# Patient Record
Sex: Male | Born: 1940 | Hispanic: Yes
Health system: Southern US, Community
[De-identification: ages and names within clinical notes are randomized; demographics above are authoritative.]

---

## 2008-10-03 DEATH — deceased

## 2014-11-07 ENCOUNTER — Inpatient Hospital Stay (HOSPITAL_COMMUNITY): Payer: Self-pay

## 2014-11-07 ENCOUNTER — Inpatient Hospital Stay (HOSPITAL_COMMUNITY)
Admission: EM | Admit: 2014-11-07 | Discharge: 2014-11-13 | DRG: 176 | Disposition: A | Discharge status: Home / Self Care | Payer: Self-pay | Source: Other Acute Inpatient Hospital | Attending: Internal Medicine | Admitting: Internal Medicine

## 2014-11-07 ENCOUNTER — Inpatient Hospital Stay (HOSPITAL_COMMUNITY): Payer: MEDICAID

## 2014-11-07 DIAGNOSIS — R748 Abnormal levels of other serum enzymes: Secondary | ICD-10-CM | POA: Diagnosis present

## 2014-11-07 DIAGNOSIS — N179 Acute kidney failure, unspecified: Secondary | ICD-10-CM | POA: Diagnosis present

## 2014-11-07 DIAGNOSIS — Z09 Encounter for follow-up examination after completed treatment for conditions other than malignant neoplasm: Secondary | ICD-10-CM

## 2014-11-07 DIAGNOSIS — E0822 Diabetes mellitus due to underlying condition with diabetic chronic kidney disease: Secondary | ICD-10-CM | POA: Insufficient documentation

## 2014-11-07 DIAGNOSIS — N183 Chronic kidney disease, stage 3 (moderate): Secondary | ICD-10-CM | POA: Diagnosis present

## 2014-11-07 DIAGNOSIS — I2699 Other pulmonary embolism without acute cor pulmonale: Secondary | ICD-10-CM | POA: Diagnosis present

## 2014-11-07 DIAGNOSIS — I82409 Acute embolism and thrombosis of unspecified deep veins of unspecified lower extremity: Secondary | ICD-10-CM | POA: Insufficient documentation

## 2014-11-07 DIAGNOSIS — N189 Chronic kidney disease, unspecified: Secondary | ICD-10-CM

## 2014-11-07 DIAGNOSIS — E1121 Type 2 diabetes mellitus with diabetic nephropathy: Secondary | ICD-10-CM | POA: Diagnosis present

## 2014-11-07 DIAGNOSIS — K219 Gastro-esophageal reflux disease without esophagitis: Secondary | ICD-10-CM | POA: Diagnosis present

## 2014-11-07 DIAGNOSIS — I2692 Saddle embolus of pulmonary artery without acute cor pulmonale: Principal | ICD-10-CM | POA: Diagnosis present

## 2014-11-07 DIAGNOSIS — R0902 Hypoxemia: Secondary | ICD-10-CM | POA: Diagnosis present

## 2014-11-07 DIAGNOSIS — I82412 Acute embolism and thrombosis of left femoral vein: Secondary | ICD-10-CM | POA: Diagnosis present

## 2014-11-07 DIAGNOSIS — I5032 Chronic diastolic (congestive) heart failure: Secondary | ICD-10-CM | POA: Diagnosis present

## 2014-11-07 LAB — COMPREHENSIVE METABOLIC PANEL
ALT: 23 U/L (ref 17–63)
AST: 16 U/L (ref 15–41)
Albumin: 3.1 g/dL — ABNORMAL LOW (ref 3.5–5.0)
Alkaline Phosphatase: 58 U/L (ref 38–126)
Anion gap: 7 (ref 5–15)
BILIRUBIN TOTAL: 1.3 mg/dL — AB (ref 0.3–1.2)
BUN: 16 mg/dL (ref 6–20)
CO2: 26 mmol/L (ref 22–32)
CREATININE: 1.34 mg/dL — AB (ref 0.61–1.24)
Calcium: 8.4 mg/dL — ABNORMAL LOW (ref 8.9–10.3)
Chloride: 103 mmol/L (ref 101–111)
GFR calc Af Amer: 58 mL/min — ABNORMAL LOW (ref >60)
GFR calc non Af Amer: 50 mL/min — ABNORMAL LOW (ref >60)
Glucose, Bld: 181 mg/dL — ABNORMAL HIGH (ref 65–99)
Potassium: 4.2 mmol/L (ref 3.5–5.1)
Sodium: 136 mmol/L (ref 135–145)
TOTAL PROTEIN: 6.8 g/dL (ref 6.5–8.1)

## 2014-11-07 LAB — BASIC METABOLIC PANEL
Anion gap: 5 (ref 5–15)
BUN: 15 mg/dL (ref 6–20)
CO2: 26 mmol/L (ref 22–32)
CREATININE: 1.19 mg/dL (ref 0.61–1.24)
Calcium: 8.6 mg/dL — ABNORMAL LOW (ref 8.9–10.3)
Chloride: 106 mmol/L (ref 101–111)
GFR calc Af Amer: >60 mL/min (ref >60)
GFR, EST NON AFRICAN AMERICAN: 58 mL/min — AB (ref >60)
Glucose, Bld: 168 mg/dL — ABNORMAL HIGH (ref 65–99)
Potassium: 4 mmol/L (ref 3.5–5.1)
SODIUM: 137 mmol/L (ref 135–145)

## 2014-11-07 LAB — CBC
HCT: 36.8 % — ABNORMAL LOW (ref 39.0–52.0)
HEMOGLOBIN: 11.9 g/dL — AB (ref 13.0–17.0)
MCH: 28.6 pg (ref 26.0–34.0)
MCHC: 32.3 g/dL (ref 30.0–36.0)
MCV: 88.5 fL (ref 78.0–100.0)
Platelets: 164 10*3/uL (ref 150–400)
RBC: 4.16 MIL/uL — ABNORMAL LOW (ref 4.22–5.81)
RDW: 13.3 % (ref 11.5–15.5)
WBC: 11.7 10*3/uL — AB (ref 4.0–10.5)

## 2014-11-07 LAB — GLUCOSE, CAPILLARY
GLUCOSE-CAPILLARY: 182 mg/dL — AB (ref 65–99)
GLUCOSE-CAPILLARY: 179 mg/dL — AB (ref 65–99)
GLUCOSE-CAPILLARY: 182 mg/dL — AB (ref 65–99)
Glucose-Capillary: 164 mg/dL — ABNORMAL HIGH (ref 65–99)
Glucose-Capillary: 140 mg/dL — ABNORMAL HIGH (ref 65–99)

## 2014-11-07 LAB — LACTIC ACID, PLASMA: Lactic Acid, Venous: 1.2 mmol/L (ref 0.5–2.0)

## 2014-11-07 LAB — HEPARIN LEVEL (UNFRACTIONATED)
HEPARIN UNFRACTIONATED: 0.43 [IU]/mL (ref 0.30–0.70)
HEPARIN UNFRACTIONATED: 0.3 [IU]/mL (ref 0.30–0.70)

## 2014-11-07 LAB — TROPONIN I
TROPONIN I: 0.14 ng/mL — AB (ref <0.031)
Troponin I: 0.33 ng/mL — ABNORMAL HIGH (ref <0.031)
Troponin I: 0.16 ng/mL — ABNORMAL HIGH (ref <0.031)

## 2014-11-07 LAB — MAGNESIUM: Magnesium: 2 mg/dL (ref 1.7–2.4)

## 2014-11-07 LAB — PHOSPHORUS: Phosphorus: 3.3 mg/dL (ref 2.5–4.6)

## 2014-11-07 MED ORDER — ASPIRIN 81 MG PO CHEW: 324.0000 mg | CHEWABLE_TABLET | ORAL | Status: AC

## 2014-11-07 MED ORDER — ASPIRIN 300 MG RE SUPP: 300.0000 mg | RECTAL | Status: AC

## 2014-11-07 MED ORDER — HEPARIN (PORCINE) IN NACL 100-0.45 UNIT/ML-% IJ SOLN
1600.0000 [IU]/h | INTRAMUSCULAR | Status: DC
  Administered 2014-11-07: 1200 [IU]/h via INTRAVENOUS
  Administered 2014-11-08: 1500 [IU]/h via INTRAVENOUS
  Administered 2014-11-08 – 2014-11-13 (×7): 1600 [IU]/h via INTRAVENOUS
  Filled 2014-11-07 (×13): qty 250

## 2014-11-07 MED ORDER — INSULIN ASPART 100 UNIT/ML ~~LOC~~ SOLN
0.0000 [IU] | SUBCUTANEOUS | Status: DC
  Administered 2014-11-07: 1 [IU] via SUBCUTANEOUS
  Administered 2014-11-07: 2 [IU] via SUBCUTANEOUS
  Administered 2014-11-07: 1 [IU] via SUBCUTANEOUS
  Administered 2014-11-07 – 2014-11-08 (×4): 2 [IU] via SUBCUTANEOUS
  Administered 2014-11-08: 5 [IU] via SUBCUTANEOUS
  Administered 2014-11-08 – 2014-11-09 (×3): 1 [IU] via SUBCUTANEOUS
  Administered 2014-11-09: 2 [IU] via SUBCUTANEOUS
  Administered 2014-11-09: 3 [IU] via SUBCUTANEOUS
  Administered 2014-11-09: 2 [IU] via SUBCUTANEOUS
  Administered 2014-11-09: 3 [IU] via SUBCUTANEOUS
  Administered 2014-11-09: 1 [IU] via SUBCUTANEOUS
  Administered 2014-11-10: 2 [IU] via SUBCUTANEOUS
  Administered 2014-11-10 (×2): 1 [IU] via SUBCUTANEOUS
  Administered 2014-11-10: 2 [IU] via SUBCUTANEOUS
  Administered 2014-11-10: 3 [IU] via SUBCUTANEOUS
  Administered 2014-11-10: 2 [IU] via SUBCUTANEOUS
  Administered 2014-11-11: 1 [IU] via SUBCUTANEOUS
  Administered 2014-11-11: 2 [IU] via SUBCUTANEOUS
  Administered 2014-11-11: 1 [IU] via SUBCUTANEOUS
  Administered 2014-11-11: 2 [IU] via SUBCUTANEOUS
  Administered 2014-11-11 – 2014-11-12 (×4): 1 [IU] via SUBCUTANEOUS
  Administered 2014-11-12 – 2014-11-13 (×2): 2 [IU] via SUBCUTANEOUS
  Administered 2014-11-13: 1 [IU] via SUBCUTANEOUS

## 2014-11-07 MED ORDER — ACETAMINOPHEN 325 MG PO TABS: 650.0000 mg | ORAL_TABLET | ORAL | Status: DC | PRN

## 2014-11-07 MED ORDER — SODIUM CHLORIDE 0.9 % IV SOLN
250.0000 mL | INTRAVENOUS | Status: DC | PRN
Start: 2014-11-07 — End: 2014-11-13

## 2014-11-07 MED ORDER — FAMOTIDINE IN NACL 20-0.9 MG/50ML-% IV SOLN
20.0000 mg | Freq: Two times a day (BID) | INTRAVENOUS | Status: DC
  Administered 2014-11-07 – 2014-11-10 (×8): 20 mg via INTRAVENOUS
  Filled 2014-11-07 (×11): qty 50

## 2014-11-07 MED ORDER — ONDANSETRON HCL 4 MG/2ML IJ SOLN: 4.0000 mg | Freq: Four times a day (QID) | INTRAMUSCULAR | Status: DC | PRN

## 2014-11-07 MED ORDER — CETYLPYRIDINIUM CHLORIDE 0.05 % MT LIQD
7.0000 mL | Freq: Two times a day (BID) | OROMUCOSAL | Status: DC
  Administered 2014-11-07 – 2014-11-13 (×13): 7 mL via OROMUCOSAL

## 2014-11-07 NOTE — Progress Notes (Signed)
ANTICOAGULATION CONSULT NOTE - Initial Consult  Pharmacy Consult for Heparin  Indication: pulmonary embolus  Allergies not on file  Patient Measurements: Height:  (170.2 cm) Weight: 179 lb 10.8 oz (81.5 kg) IBW/kg (Calculated) : 66.1  Vital Signs: Temp: 97.9 F (36.6 C) (08/04 0420) Temp Source: Oral (08/04 0420) BP: 126/72 mmHg (08/04 0415) Pulse Rate: 106 (08/04 0415)   Labs from outside hospital Hgb 13.7 Scr 1.8 INR 1  Assessment: 74 y/o M transfer from outside hospital for new onset PE. Heparin 5000 units BOLUS was given ~0030 and pt is currently on heparin drip at 1200 units/hr.   Goal of Therapy:  Heparin level 0.3-0.7 units/ml Monitor platelets by anticoagulation protocol: Yes   Plan:  -Continue heparin drip at 1200 units/hr -0800 HL -Daily CBC/HL -Monitor for bleeding  Abran Duke 11/07/2014,4:28 AM

## 2014-11-07 NOTE — Progress Notes (Addendum)
ANTICOAGULATION CONSULT NOTE - Follow Up Consult  Pharmacy Consult for Heparin  Indication: pulmonary embolus  Allergies not on file  Patient Measurements: Height:  (170.2 cm) Weight: 179 lb 10.8 oz (81.5 kg) IBW/kg (Calculated) : 66.1  Vital Signs: Temp: 97.6 F (36.4 C) (08/04 0910) Temp Source: Oral (08/04 0910) BP: 115/85 mmHg (08/04 0900) Pulse Rate: 107 (08/04 0900)   Labs from outside hospital Hgb 13.7 Scr 1.8 INR 1  Assessment: 74 y/o M on IV heparin at 1200 units/hr for new onset PE. Heparin level this AM is therapeutic at 0.43. Other labs as above. No s/s of bleeding observed.   Goal of Therapy:  Heparin level 0.3-0.7 units/ml Monitor platelets by anticoagulation protocol: Yes   Plan:  -Continue heparin drip at 1200 units/hr -1700 HL -Daily CBC/HL -Monitor for bleeding  Vinnie Level, PharmD., BCPS Clinical Pharmacist Pager [phone]   ADDENDUM:  HL came back borderline therapeutic at 0.3. No issues per RN.  Plan: Increase heparin gtt to 1300 units/hr Check HL at 0200 Monitor daily HL, CBC, s/s of bleed

## 2014-11-07 NOTE — H&P (Signed)
PULMONARY / CRITICAL CARE MEDICINE   Name: Antonio Medina MRN: [id number] DOB: 1940-07-05    ADMISSION DATE:  11/07/2014  REFERRING MD :  [facility]  CHIEF COMPLAINT:  Chest pain  INITIAL PRESENTATION: 74 year old transferred from [facility] for submassive saddle PE, hemodynamically stable  STUDIES:  CT angiogram 8/3 >> saddle PE, RV/LV ratio 1.8  SIGNIFICANT EVENTS:    HISTORY OF PRESENT ILLNESS:  74 year old Spanish only speaking diabetic, presented with chest pain ongoing for 3 weeks-he does not see a doctor. He is Spanish-speaking only and no interpreter available at the time of history taking. He reports substernal chest pain radiating to his right side. He came into [place] where he had a positive d-dimer, no recent surgery, CT scan showed saddle PE, was placed on heparin and transferred to [facility]. First troponin was negative, repeat troponin was 0.3 . Labs also showed a creatinine 1.8. He was hypoxic to 91% on room air, no hypertension documented  PAST MEDICAL HISTORY :   has no past medical history on file.  has no past surgical history on file. Prior to Admission medications   Not on File   Allergies not on file  FAMILY HISTORY:  has no family status information on file.  SOCIAL HISTORY:    REVIEW OF SYSTEMS:  Detailed review of systems-unable to obtain due to language barrier. Says chest pain is improved, dyspnea is improved Denies bleeding from any site-no history of hematemesis or melenic All he reports diabetes which is controlled with medication No history of syncope or recent trauma No prior history of head injury  SUBJECTIVE:   VITAL SIGNS: Temp:  [97.9 F (36.6 C)] 97.9 F (36.6 C) (08/04 0420) Pulse Rate:  [102-113] 102 (08/04 0700) Resp:  [18-24] 23 (08/04 0700) BP: (107-134)/(66-89) 134/86 mmHg (08/04 0700) SpO2:  [97 %-99 %] 98 % (08/04 0700) Weight:  [179 lb 10.8 oz (81.5 kg)] 179 lb 10.8 oz (81.5 kg) (08/04 0405) HEMODYNAMICS:   VENTILATOR  SETTINGS:   INTAKE / OUTPUT:  Intake/Output Summary (Last 24 hours) at 11/07/14 0718 Last data filed at 11/07/14 0700  Gross per 24 hour  Intake     15 ml  Output    270 ml  Net   -255 ml    PHYSICAL EXAMINATION: Gen. Pleasant, well-nourished, in no distress, normal affect ENT - no lesions, no post nasal drip Neck: No JVD, no thyromegaly, no carotid bruits Lungs: no use of accessory muscles, no dullness to percussion, clear without rales or rhonchi  Cardiovascular: Rhythm regular, heart sounds  normal, no murmurs, no peripheral edema Abdomen: soft and non-tender, no hepatosplenomegaly, BS normal. Musculoskeletal: No deformities, no cyanosis or clubbing Neuro:  alert, non focal Skin:  Warm, no lesions/ rash   LABS:  CBC  Recent Labs Lab 11/07/14 0510  WBC 11.7*  HGB 11.9*  HCT 36.8*  PLT 164   Coag's No results for input(s): APTT, INR in the last 168 hours. BMET  Recent Labs Lab 11/07/14 0510  NA 136  K 4.2  CL 103  CO2 26  BUN 16  CREATININE 1.34*  GLUCOSE 181*   Electrolytes  Recent Labs Lab 11/07/14 0510  CALCIUM 8.4*  MG 2.0  PHOS 3.3   Sepsis Markers  Recent Labs Lab 11/07/14 0510  LATICACIDVEN 1.2   ABG No results for input(s): PHART, PCO2ART, PO2ART in the last 168 hours. Liver Enzymes  Recent Labs Lab 11/07/14 0510  AST 16  ALT 23  ALKPHOS 58  BILITOT 1.3*  ALBUMIN 3.1*   Cardiac Enzymes  Recent Labs Lab 11/07/14 0510  TROPONINI 0.33*   Glucose  Recent Labs Lab 11/07/14 0419  GLUCAP 164*    Imaging Dg Chest Port 1 View  11/07/2014   CLINICAL DATA:  Dyspnea  EXAM: PORTABLE CHEST - 1 VIEW  COMPARISON:  None.  FINDINGS: There is patchy opacity in the lateral right base with elevation of the hemidiaphragm. This may represent atelectasis or scarring or early infiltrate. The left lung is clear. There is no large effusion. Heart size is normal.  IMPRESSION: Patchy right lateral base opacity. This could represent scarring,  atelectasis or early infectious infiltrate.   Electronically Signed   By: Ellery Plunk M.D.   On: 11/07/2014 05:08     ASSESSMENT / PLAN:  PULMONARY  A: Acute submassive, saddle PE P:   IV heparin Obtain echo and duplex Called and spoke to interventional radiology-he does have a pesi score of 1 due to hypoxia but is hemodynamically stable. The high RV/LV ratio 1.8 is concerning-however he seems to have done well with heparin alone and would continue conservative approach unless he deteriorates or troponin climbs higher   RENAL A:  At-risk AKi, cr 1.8- not sure if this is baseline P:   Hydrate post contrast, follow creatinine  GASTROINTESTINAL A:  No issues P:   Nothing by mouth pending course for next 12 hours  HEMATOLOGIC A:  On IV heparin P:  Follow CBC and platelets   ENDOCRINE A:  Diabetes   P:   SSI   FAMILY  - Updates: None at bedside  - Inter-disciplinary family meet or Palliative Care meeting due by:  NA    TODAY'S SUMMARY: Acute submassive pulmonary embolism, conservative approach, IRcalled buthold off on catheter lysis    Cyril Mourning MD. FCCP.  Pulmonary & Critical care Pager [phone] If no response call [phone]    11/07/2014, 7:18 AM

## 2014-11-07 NOTE — Progress Notes (Signed)
eLink Physician-Brief Progress Note Patient Name: Antonio Medina DOB: 05/25/1940 MRN: [id number]   Date of Service  11/07/2014  HPI/Events of Note  74 yo male transferred from [place] after complaints of CP, SOB, found to have saddle PE with right heart strain on CT Chest, hemodynamic stable.  Got heparin bolus and started on heparin gtt at [facility], currently stable.   eICU Interventions  Saddle PE - CXR, ECHO - cont with Heparin gtt, may require tPA if hemodynamics change - cbc, bmp, mg, phos - full note to follow      Intervention Category Evaluation Type: New Patient Evaluation  Teila Skalsky 11/07/2014, 6:34 AM

## 2014-11-07 NOTE — Progress Notes (Signed)
  Echocardiogram 2D Echocardiogram has been performed.  Arvil Chaco 11/07/2014, 2:08 PM

## 2014-11-07 NOTE — Progress Notes (Addendum)
PULMONARY / CRITICAL CARE MEDICINE   Name: Antonio Medina MRN: [id number] DOB: June 16, 1940    ADMISSION DATE:  11/07/2014  TODAY'S DATE: 11/07/14  REFERRING MD :  [facility]  CHIEF COMPLAINT:  Chest pain  INITIAL PRESENTATION: 74 year old transferred from [facility] for submassive saddle PE, hemodynamically stable  STUDIES:  CT angiogram 8/3 >> saddle PE, RV/LV ratio 1.8 8/4 echo>>>The cavity size was dilated. Wall thickness was normal. Systolic function was moderately reduced. 55%, grade 1 diast, rv overload, PA 45  SIGNIFICANT EVENTS:  SUBJECTIVE: no distress, on min O2  VITAL SIGNS: Temp:  [97.6 F (36.4 C)-97.9 F (36.6 C)] 97.6 F (36.4 C) (08/04 0910) Pulse Rate:  [102-113] 107 (08/04 0900) Resp:  [18-24] 18 (08/04 0900) BP: (107-134)/(66-89) 115/85 mmHg (08/04 0900) SpO2:  [94 %-99 %] 96 % (08/04 0900) Weight:  [81.5 kg (179 lb 10.8 oz)] 81.5 kg (179 lb 10.8 oz) (08/04 0405) HEMODYNAMICS:   VENTILATOR SETTINGS:   INTAKE / OUTPUT:  Intake/Output Summary (Last 24 hours) at 11/07/14 0938 Last data filed at 11/07/14 0900  Gross per 24 hour  Intake     51 ml  Output    270 ml  Net   -219 ml    PHYSICAL EXAMINATION: General: awake, alert distress Neuro: nonfocal, perrl HEENT: no stridor PULM: CTA CV: s1 s2 RRR GI: soft, BS wnl, no r Extremities: no edema   LABS:  CBC  Recent Labs Lab 11/07/14 0510  WBC 11.7*  HGB 11.9*  HCT 36.8*  PLT 164   Coag's No results for input(s): APTT, INR in the last 168 hours. BMET  Recent Labs Lab 11/07/14 0510  NA 136  K 4.2  CL 103  CO2 26  BUN 16  CREATININE 1.34*  GLUCOSE 181*   Electrolytes  Recent Labs Lab 11/07/14 0510  CALCIUM 8.4*  MG 2.0  PHOS 3.3   Sepsis Markers  Recent Labs Lab 11/07/14 0510  LATICACIDVEN 1.2   ABG No results for input(s): PHART, PCO2ART, PO2ART in the last 168 hours. Liver Enzymes  Recent Labs Lab 11/07/14 0510  AST 16  ALT 23  ALKPHOS 58  BILITOT 1.3*   ALBUMIN 3.1*   Cardiac Enzymes  Recent Labs Lab 11/07/14 0510  TROPONINI 0.33*   Glucose  Recent Labs Lab 11/07/14 0419 11/07/14 0854  GLUCAP 164* 182*    Imaging Dg Chest Port 1 View  11/07/2014   CLINICAL DATA:  Dyspnea  EXAM: PORTABLE CHEST - 1 VIEW  COMPARISON:  None.  FINDINGS: There is patchy opacity in the lateral right base with elevation of the hemidiaphragm. This may represent atelectasis or scarring or early infiltrate. The left lung is clear. There is no large effusion. Heart size is normal.  IMPRESSION: Patchy right lateral base opacity. This could represent scarring, atelectasis or early infectious infiltrate.   Electronically Signed   By: Andreas Newport M.D.   On: 11/07/2014 05:08     ASSESSMENT / PLAN:  PULMONARY  A: Acute submassive, saddle PE P:   IV heparin Duplex lowers pending Chest pain free He is now a long time period from PE onset, risk / benefit ratio favors heparin continuation pcxr for infarct risk, had linear changes rt base last pcxr  RENAL A:  At-risk AKi, cr 1.8- not sure if this is baseline, ATN likley resolving P:   Allow pos balance Chem in am not needed  GASTROINTESTINAL A:  No issues P:   Feed pepcid  HEMATOLOGIC A:  On IV  heparin P:  Start oral agent From [place], unsure of availability to meds / add coumadin (most likley to be able to get) Needs cancer screening, ana, esr in am   ENDOCRINE A:  Diabetes   P:   SSI   FAMILY  - Updates: None at bedside  - Inter-disciplinary family meet or Palliative Care meeting due by:  NA   to tele, [facility]  Lavon Paganini. Titus Mould, MD, [place] Pgr: [facility]  11/07/2014, 9:38 AM

## 2014-11-07 NOTE — Progress Notes (Signed)
IR aware of this pt with (B)PE and right heart strain. Currently plan to hold off PE lysis procedure. Brief discussion with pt, spanish speaking only, no family present. C/o left sided chest pain and mild SOB, as well as mild headache. No history recorded but no apparent contraindications for lysis treatment. BP 124/79 mmHg  Pulse 111  Temp(Src) 97.6 F (36.4 C) (Oral)  Resp 20  Ht  (1.702 m)  Wt 179 lb 10.8 oz (81.5 kg)  BMI 28.13 kg/m2  SpO2 94% Echo and LE venous duplex ordered, not yet done. On IV hep gtt. PCCM to re-eval this am and let IR team know if PE lysis is reconsidered. Keeping pt NPO for the moment until final decision. Will report to Dr. Linus Orn PA-C Interventional Radiology 11/07/2014 9:16 AM

## 2014-11-08 ENCOUNTER — Inpatient Hospital Stay (HOSPITAL_COMMUNITY): Payer: Self-pay

## 2014-11-08 DIAGNOSIS — I2699 Other pulmonary embolism without acute cor pulmonale: Secondary | ICD-10-CM | POA: Insufficient documentation

## 2014-11-08 LAB — GLUCOSE, CAPILLARY
GLUCOSE-CAPILLARY: 146 mg/dL — AB (ref 65–99)
GLUCOSE-CAPILLARY: 152 mg/dL — AB (ref 65–99)
Glucose-Capillary: 162 mg/dL — ABNORMAL HIGH (ref 65–99)
Glucose-Capillary: 125 mg/dL — ABNORMAL HIGH (ref 65–99)
Glucose-Capillary: 174 mg/dL — ABNORMAL HIGH (ref 65–99)
Glucose-Capillary: 254 mg/dL — ABNORMAL HIGH (ref 65–99)
Glucose-Capillary: 147 mg/dL — ABNORMAL HIGH (ref 65–99)

## 2014-11-08 LAB — CBC
HCT: 35.8 % — ABNORMAL LOW (ref 39.0–52.0)
Hemoglobin: 11.7 g/dL — ABNORMAL LOW (ref 13.0–17.0)
MCH: 29 pg (ref 26.0–34.0)
MCHC: 32.7 g/dL (ref 30.0–36.0)
MCV: 88.8 fL (ref 78.0–100.0)
Platelets: 168 10*3/uL (ref 150–400)
RBC: 4.03 MIL/uL — ABNORMAL LOW (ref 4.22–5.81)
RDW: 13.4 % (ref 11.5–15.5)
WBC: 9.3 10*3/uL (ref 4.0–10.5)

## 2014-11-08 LAB — HEPARIN LEVEL (UNFRACTIONATED)
HEPARIN UNFRACTIONATED: 0.26 [IU]/mL — AB (ref 0.30–0.70)
Heparin Unfractionated: 0.65 IU/mL (ref 0.30–0.70)

## 2014-11-08 MED ORDER — HEPARIN BOLUS VIA INFUSION
4000.0000 [IU] | Freq: Once | INTRAVENOUS | Status: AC
  Administered 2014-11-08: 4000 [IU] via INTRAVENOUS
  Filled 2014-11-08: qty 4000

## 2014-11-08 MED ORDER — WARFARIN SODIUM 7.5 MG PO TABS
7.5000 mg | ORAL_TABLET | Freq: Once | ORAL | Status: AC
  Administered 2014-11-08: 7.5 mg via ORAL
  Filled 2014-11-08: qty 1

## 2014-11-08 MED ORDER — WARFARIN - PHARMACIST DOSING INPATIENT
Freq: Every day | Status: DC
  Administered 2014-11-09 – 2014-11-11 (×4)

## 2014-11-08 NOTE — Progress Notes (Addendum)
ANTICOAGULATION CONSULT NOTE - Follow Up Consult  Pharmacy Consult for Heparin/Warfarin  Indication: Saddle pulmonary embolus  No Known Allergies  Patient Measurements: Height:  (170.2 cm) Weight: 182 lb 1.6 oz (82.6 kg) IBW/kg (Calculated) : 66.1  Vital Signs: Temp: 97.8 F (36.6 C) (08/05 0738) Temp Source: Oral (08/05 0738) BP: 156/78 mmHg (08/05 0800) Pulse Rate: 77 (08/05 0800)  Labs:  Recent Labs  11/07/14 0510 11/07/14 0842 11/07/14 1033 11/07/14 1625 11/08/14 0208  HGB 11.9*  --   --   --  11.7*  HCT 36.8*  --   --   --  35.8*  PLT 164  --   --   --  168  HEPARINUNFRC  --  0.43  --  0.30 0.26*  CREATININE 1.34*  --  1.19  --   --   TROPONINI 0.33*  --  0.16* 0.14*  --     Estimated Creatinine Clearance: 55.2 mL/min (by C-G formula based on Cr of 1.19).   Assessment: 71 YOM with new onset saddle PE currently on IV heparin @ 1500 units/hr. HL this AM was sub-therapeutic at 0.26. Patient is felt to be high risk for clot formation now.   Goal of Therapy:  Heparin level 0.3-0.7 units/ml Monitor platelets by anticoagulation protocol: Yes   Plan:  -Give heparin 4000 units bolus, then increase heparin infusion to 1600 units/hr  -1700 HL -Give Coumadin 7.5 mg once today  -Daily CBC/HL and Pt/INR  -Monitor for bleeding  Vinnie Level, PharmD., BCPS Clinical Pharmacist Pager [phone]   ======================   Addendum: - HL 0.65, high therapeutic range though post a large bolus   Plan: - Continue heparin gtt at 1600 units/hr - Check confirmatory HL    Freddi Schrager D. Laney Potash, PharmD, BCPS Pager:  [phone] 11/08/2014, 5:47 PM

## 2014-11-08 NOTE — Progress Notes (Signed)
*  PRELIMINARY RESULTS* Vascular Ultrasound Lower extremity venous duplex has been completed.  Preliminary findings: DVT noted in the left common femoral vein. No DVT RLE.  Farrel Demark, RDMS, RVT  11/08/2014, 11:27 AM

## 2014-11-08 NOTE — Progress Notes (Signed)
ANTICOAGULATION CONSULT NOTE - Follow Up Consult  Pharmacy Consult for Heparin  Indication: pulmonary embolus  No Known Allergies  Patient Measurements: Height:  (170.2 cm) Weight: 179 lb 10.8 oz (81.5 kg) IBW/kg (Calculated) : 66.1  Vital Signs: Temp: 98.6 F (37 C) (08/04 2325) Temp Source: Oral (08/04 2325) BP: 133/82 mmHg (08/04 2100) Pulse Rate: 93 (08/04 2100)  Labs:  Recent Labs  11/07/14 0510 11/07/14 0842 11/07/14 1033 11/07/14 1625 11/08/14 0208  HGB 11.9*  --   --   --  11.7*  HCT 36.8*  --   --   --  35.8*  PLT 164  --   --   --  168  HEPARINUNFRC  --  0.43  --  0.30 0.26*  CREATININE 1.34*  --  1.19  --   --   TROPONINI 0.33*  --  0.16* 0.14*  --     Estimated Creatinine Clearance: 54.8 mL/min (by C-G formula based on Cr of 1.19).   Assessment: Sub-therapeutic heparin level, has been trending down  Goal of Therapy:  Heparin level 0.3-0.7 units/ml Monitor platelets by anticoagulation protocol: Yes   Plan:  -Increase heparin drip to 1500 units/hr -1100 HL -Daily CBC/HL -Monitor for bleeding -F/U plans for oral anti-coagulation   Antonio Medina 11/08/2014,3:06 AM

## 2014-11-08 NOTE — Progress Notes (Signed)
Pt arrived to room 3e19 from 2mw via wc; in no apparent distress; call bell and urinal within reach, bed alarm on; see flowsheet for full assessment/vs Marvia Pickles, RN

## 2014-11-09 ENCOUNTER — Inpatient Hospital Stay (HOSPITAL_COMMUNITY): Payer: Self-pay

## 2014-11-09 LAB — CBC WITH DIFFERENTIAL/PLATELET
Basophils Absolute: 0 10*3/uL (ref 0.0–0.1)
Basophils Relative: 0 % (ref 0–1)
Eosinophils Absolute: 0.3 10*3/uL (ref 0.0–0.7)
Eosinophils Relative: 4 % (ref 0–5)
HEMATOCRIT: 37.2 % — AB (ref 39.0–52.0)
Hemoglobin: 12.1 g/dL — ABNORMAL LOW (ref 13.0–17.0)
Lymphocytes Relative: 27 % (ref 12–46)
Lymphs Abs: 2.3 10*3/uL (ref 0.7–4.0)
MCH: 29 pg (ref 26.0–34.0)
MCHC: 32.5 g/dL (ref 30.0–36.0)
MCV: 89.2 fL (ref 78.0–100.0)
MONOS PCT: 8 % (ref 3–12)
Monocytes Absolute: 0.7 10*3/uL (ref 0.1–1.0)
NEUTROS ABS: 5.3 10*3/uL (ref 1.7–7.7)
Neutrophils Relative %: 61 % (ref 43–77)
Platelets: 192 10*3/uL (ref 150–400)
RBC: 4.17 MIL/uL — AB (ref 4.22–5.81)
RDW: 13.4 % (ref 11.5–15.5)
WBC: 8.6 10*3/uL (ref 4.0–10.5)

## 2014-11-09 LAB — GLUCOSE, CAPILLARY
GLUCOSE-CAPILLARY: 186 mg/dL — AB (ref 65–99)
Glucose-Capillary: 140 mg/dL — ABNORMAL HIGH (ref 65–99)
Glucose-Capillary: 166 mg/dL — ABNORMAL HIGH (ref 65–99)
Glucose-Capillary: 209 mg/dL — ABNORMAL HIGH (ref 65–99)
Glucose-Capillary: 205 mg/dL — ABNORMAL HIGH (ref 65–99)
Glucose-Capillary: 131 mg/dL — ABNORMAL HIGH (ref 65–99)

## 2014-11-09 LAB — HEPARIN LEVEL (UNFRACTIONATED)
Heparin Unfractionated: 0.46 IU/mL (ref 0.30–0.70)
Heparin Unfractionated: 0.44 IU/mL (ref 0.30–0.70)

## 2014-11-09 LAB — BASIC METABOLIC PANEL
Anion gap: 6 (ref 5–15)
BUN: 16 mg/dL (ref 6–20)
CALCIUM: 8.5 mg/dL — AB (ref 8.9–10.3)
CHLORIDE: 101 mmol/L (ref 101–111)
CO2: 25 mmol/L (ref 22–32)
Creatinine, Ser: 1.4 mg/dL — ABNORMAL HIGH (ref 0.61–1.24)
GFR calc Af Amer: 55 mL/min — ABNORMAL LOW (ref >60)
GFR, EST NON AFRICAN AMERICAN: 48 mL/min — AB (ref >60)
GLUCOSE: 217 mg/dL — AB (ref 65–99)
Potassium: 3.9 mmol/L (ref 3.5–5.1)
Sodium: 132 mmol/L — ABNORMAL LOW (ref 135–145)

## 2014-11-09 LAB — PROTIME-INR
INR: 1.11 (ref 0.00–1.49)
Prothrombin Time: 14.5 seconds (ref 11.6–15.2)

## 2014-11-09 LAB — SEDIMENTATION RATE: Sed Rate: 63 mm/hr — ABNORMAL HIGH (ref 0–16)

## 2014-11-09 MED ORDER — WARFARIN SODIUM 7.5 MG PO TABS
7.5000 mg | ORAL_TABLET | Freq: Once | ORAL | Status: AC
  Administered 2014-11-09: 7.5 mg via ORAL
  Filled 2014-11-09: qty 1

## 2014-11-09 MED ORDER — COUMADIN BOOK
Freq: Once | Status: AC
  Administered 2014-11-09: 1
  Filled 2014-11-09: qty 1

## 2014-11-09 NOTE — Progress Notes (Signed)
ANTICOAGULATION CONSULT NOTE - Follow Up Consult  Pharmacy Consult for Heparin  Indication: pulmonary embolus  No Known Allergies  Patient Measurements: Height:  (167.6 cm) Weight: 183 lb (83.008 kg) IBW/kg (Calculated) : 63.8  Vital Signs: Temp: 98.6 F (37 C) (08/06 0212) Temp Source: Oral (08/06 0212) BP: 132/68 mmHg (08/06 0212) Pulse Rate: 72 (08/06 0212)  Labs:  Recent Labs  11/07/14 0510  11/07/14 1033 11/07/14 1625 11/08/14 0208 11/08/14 1710 11/09/14 0044  HGB 11.9*  --   --   --  11.7*  --   --   HCT 36.8*  --   --   --  35.8*  --   --   PLT 164  --   --   --  168  --   --   HEPARINUNFRC  --   < >  --  0.30 0.26* 0.65 0.46  CREATININE 1.34*  --  1.19  --   --   --   --   TROPONINI 0.33*  --  0.16* 0.14*  --   --   --   < > = values in this interval not displayed.  Estimated Creatinine Clearance: 54.2 mL/min (by C-G formula based on Cr of 1.19).  Assessment: Heparin level therapeutic x 2  Goal of Therapy:  Heparin level 0.3-0.7 units/ml Monitor platelets by anticoagulation protocol: Yes   Plan:  -Continue heparin at 1600 units/hr -Daily CBC/HL -Monitor for bleeding  Antonio Medina 11/09/2014,2:30 AM

## 2014-11-09 NOTE — Progress Notes (Signed)
ANTICOAGULATION CONSULT NOTE - Follow Up Consult  Pharmacy Consult for Heparin and Warfarin Indication: pulmonary embolus  No Known Allergies  Patient Measurements: Height:  (167.6 cm) Weight: 182 lb 15.5 oz (82.994 kg) (Scale B) IBW/kg (Calculated) : 63.8  Assessment: 74yo male found to have saddle PE and started on IV Heparin.  He is therapeutic this morning with his heparin therapy with a level of 0.44.  No noted bleeding complications and his CBC is stable as well as his platelets.  He received one dose of Warfarin last evening and his INR this morning is 1.1.  Would expect him to trend higher after 72 hours of therapy.  Education:  I have sent a Spanish Warfarin teaching book for patient and family to review.   Goal of Therapy:  INR -2.0 - 3.0 Heparin level 0.3-0.7 units/ml Monitor platelets by anticoagulation protocol: Yes   Plan:  -Continue heparin at 1600 units/hr -Warfarin 7.5 mg x 1 today -Daily CBC/HL -Monitor for bleeding  Chinita Greenland 11/09/2014,7:22 AM

## 2014-11-09 NOTE — Progress Notes (Signed)
[facility] PROGRESS NOTE  Rochelle Nephew GXQ:[id number] DOB: 06/08/1940 DOA: 11/07/2014 PCP: No primary care provider on file.  Assessment/Plan: Acute submassive, saddle PE -IV heparin -Duplex lowers: DVT noted in the left common femoral vein. -Needs cancer screening, ana, esr in am  -Chest x ray: Mildly increased bilateral mid to lower lung zone opacities. Thiscould be simply due to atelectasis. Possibility of developing pulmonary infarction not excluded. -Follow x ray.  -Incentive spirometry. -he will need PCP for INR.   AKi, cr 1.8- not sure if this is baseline, ATN likley resolving Cr at 1.4. Monitor.  Avoid nephrotoxin.   Diabetes  SSI. Will need prescription for glipizide. Wont be able to resume metformin due to AKI.    Code Status: Full Code.  Family Communication: care discussed with patient in [place].  Disposition Plan: remain in the hospital for treatment with IV heparin, coumadin    Consultants:  CCM admitted patient.   Procedures: Doppler: Preliminary findings: DVT noted in the left common femoral vein. No DVT RLE.  ECHO; - Left ventricle: The cavity size was normal. Systolic function was normal. The estimated ejection fraction was in the range of 50% to 55%. Although no diagnostic regional wall motion abnormality was identified, this possibility cannot be completely excluded on the basis of this study. Doppler parameters are consistent with abnormal left ventricular relaxation (grade 1 diastolic dysfunction). - Ventricular septum: The contour showed systolic flattening. These changes are consistent with RV pressure overload. - Left atrium: The atrium was mildly dilated. - Right ventricle: The cavity size was dilated. Wall thickness was normal. Systolic function was moderately reduced. - Pulmonary arteries: Systolic pressure was moderately increased. PA peak pressure: 45 mm Hg (S).  Antibiotics: None   HPI/Subjective: He is  feeling better, chest pain better.  He is from [place]. He is here visiting daughter. He was supposed to return to [place] 8-24.  Doesn't have PCP  Objective: Filed Vitals:   11/09/14 0546  BP: 133/61  Pulse: 70  Temp: 98.6 F (37 C)  Resp: 20    Intake/Output Summary (Last 24 hours) at 11/09/14 0736 Last data filed at 11/09/14 0548  Gross per 24 hour  Intake  384.1 ml  Output   1726 ml  Net -1341.9 ml   Filed Weights   11/08/14 0300 11/08/14 1019 11/09/14 0546  Weight: 82.6 kg (182 lb 1.6 oz) 83.008 kg (183 lb) 82.994 kg (182 lb 15.5 oz)    Exam:   General:  Alert in no distress.   Cardiovascular: S 1, S 2 RRR  Respiratory: CTA  Abdomen: BS present, soft, nt  Musculoskeletal: trace edema  Data Reviewed: Basic Metabolic Panel:  Recent Labs Lab 11/07/14 0510 11/07/14 1033  NA 136 137  K 4.2 4.0  CL 103 106  CO2 26 26  GLUCOSE 181* 168*  BUN 16 15  CREATININE 1.34* 1.19  CALCIUM 8.4* 8.6*  MG 2.0  --   PHOS 3.3  --    Liver Function Tests:  Recent Labs Lab 11/07/14 0510  AST 16  ALT 23  ALKPHOS 58  BILITOT 1.3*  PROT 6.8  ALBUMIN 3.1*   No results for input(s): LIPASE, AMYLASE in the last 168 hours. No results for input(s): AMMONIA in the last 168 hours. CBC:  Recent Labs Lab 11/07/14 0510 11/08/14 0208 11/09/14 0328  WBC 11.7* 9.3 8.6  NEUTROABS  --   --  5.3  HGB 11.9* 11.7* 12.1*  HCT 36.8* 35.8* 37.2*  MCV 88.5 88.8  89.2  PLT 164 168 192   Cardiac Enzymes:  Recent Labs Lab 11/07/14 0510 11/07/14 1033 11/07/14 1625  TROPONINI 0.33* 0.16* 0.14*   BNP (last 3 results) No results for input(s): BNP in the last 8760 hours.  ProBNP (last 3 results) No results for input(s): PROBNP in the last 8760 hours.  CBG:  Recent Labs Lab 11/08/14 1253 11/08/14 1808 11/08/14 2133 11/08/14 2354 11/09/14 0401  GLUCAP 174* 254* 147* 152* 140*    No results found for this or any previous visit (from the past 240 hour(s)).    Studies: No results found.  Scheduled Meds: . antiseptic oral rinse  7 mL Mouth Rinse BID  . coumadin book   Does not apply Once  . famotidine (PEPCID) IV  20 mg Intravenous Q12H  . insulin aspart  0-9 Units Subcutaneous 6 times per day  . warfarin  7.5 mg Oral ONCE-1800  . Warfarin - Pharmacist Dosing Inpatient   Does not apply q1800   Continuous Infusions: . heparin 1,600 Units/hr (11/08/14 2136)    Active Problems:   Acute pulmonary embolus   PE (pulmonary embolism)   Pulmonary emboli    Time spent: 35 minutes.     Niel Hummer A  [facility] Pager [phone]. If 7PM-7AM, please contact night-coverage at [email or web address], password [facility] 11/09/2014, 7:36 AM  LOS: 2 days

## 2014-11-10 LAB — CBC
HEMATOCRIT: 37.7 % — AB (ref 39.0–52.0)
Hemoglobin: 12.3 g/dL — ABNORMAL LOW (ref 13.0–17.0)
MCH: 29.1 pg (ref 26.0–34.0)
MCHC: 32.6 g/dL (ref 30.0–36.0)
MCV: 89.1 fL (ref 78.0–100.0)
Platelets: 214 10*3/uL (ref 150–400)
RBC: 4.23 MIL/uL (ref 4.22–5.81)
RDW: 13.2 % (ref 11.5–15.5)
WBC: 8.2 10*3/uL (ref 4.0–10.5)

## 2014-11-10 LAB — GLUCOSE, CAPILLARY
GLUCOSE-CAPILLARY: 130 mg/dL — AB (ref 65–99)
GLUCOSE-CAPILLARY: 154 mg/dL — AB (ref 65–99)
GLUCOSE-CAPILLARY: 203 mg/dL — AB (ref 65–99)
Glucose-Capillary: 152 mg/dL — ABNORMAL HIGH (ref 65–99)
Glucose-Capillary: 158 mg/dL — ABNORMAL HIGH (ref 65–99)

## 2014-11-10 LAB — PROTIME-INR
INR: 1.32 (ref 0.00–1.49)
Prothrombin Time: 16.5 seconds — ABNORMAL HIGH (ref 11.6–15.2)

## 2014-11-10 LAB — HEPARIN LEVEL (UNFRACTIONATED): Heparin Unfractionated: 0.63 IU/mL (ref 0.30–0.70)

## 2014-11-10 MED ORDER — TRAMADOL HCL 50 MG PO TABS
25.0000 mg | ORAL_TABLET | Freq: Four times a day (QID) | ORAL | Status: DC | PRN
  Administered 2014-11-10: 25 mg via ORAL
  Filled 2014-11-10: qty 1

## 2014-11-10 MED ORDER — GUAIFENESIN ER 600 MG PO TB12
600.0000 mg | ORAL_TABLET | Freq: Two times a day (BID) | ORAL | Status: DC
  Administered 2014-11-10 – 2014-11-13 (×7): 600 mg via ORAL
  Filled 2014-11-10 (×9): qty 1

## 2014-11-10 MED ORDER — WARFARIN SODIUM 7.5 MG PO TABS
7.5000 mg | ORAL_TABLET | Freq: Once | ORAL | Status: AC
  Administered 2014-11-10: 7.5 mg via ORAL
  Filled 2014-11-10: qty 1

## 2014-11-10 NOTE — Progress Notes (Addendum)
ANTICOAGULATION CONSULT NOTE  Pharmacy Consult for Heparin and Warfarin Indication: pulmonary embolus  No Known Allergies  Patient Measurements: Height:  (167.6 cm) Weight: 182 lb 8.7 oz (82.8 kg) IBW/kg (Calculated) : 63.8  Assessment: 74yo male found to have saddle PE and started on IV Heparin. He is therapeutic on heparin, his INR is 1.32, as expected after 2 days of warfarin. No noted bleeding complications and his CBC is stable as well as his platelets.    Goal of Therapy:  INR -2.0 - 3.0 Heparin level 0.3-0.7 units/ml Monitor platelets by anticoagulation protocol: Yes   Plan:  -Continue heparin at 1600 units/hr -Warfarin 7.5 mg x 1 today -Daily CBC/HL/INR -Monitor for s/sx bleeding -Continue heparin or Lovenox for a minimum of 5 days and until INR is therapeutic > 24 hours  Lachrista Heslin, Darl Householder 11/10/2014,8:30 AM

## 2014-11-10 NOTE — Progress Notes (Signed)
[facility] PROGRESS NOTE  Antonio Medina WPY:[id number] DOB: [birth year] DOA: 11/07/2014 PCP: No primary care provider on file.  Assessment/Plan: Acute submassive, saddle PE -IV heparin -Duplex lowers: DVT noted in the left common femoral vein. -Needs cancer screening, ana , esr 63 -Chest x ray: Mildly increased bilateral mid to lower lung zone opacities. Thiscould be simply due to atelectasis. Possibility of developing pulmonary infarction not excluded. -Follow intermittent  x ray.  -Incentive spirometry. -he will need PCP for INR.  -continue with coumadin. INR 1.3.  AKi, cr 1.8- not sure if this is baseline, ATN likley resolving Cr at 1.4. Monitor.  Avoid nephrotoxin.   Diabetes  SSI. Will need prescription for glipizide. Wont be able to resume metformin due to AKI.    Code Status: Full Code.  Family Communication: care discussed with patient in [place]. Discussed care with daughter who was at bedside.  Disposition Plan: remain in the hospital for treatment with IV heparin, coumadin    Consultants:  CCM admitted patient.   Procedures: Doppler: Preliminary findings: DVT noted in the left common femoral vein. No DVT RLE.  ECHO; - Left ventricle: The cavity size was normal. Systolic function was normal. The estimated ejection fraction was in the range of 50% to 55%. Although no diagnostic regional wall motion abnormality was identified, this possibility cannot be completely excluded on the basis of this study. Doppler parameters are consistent with abnormal left ventricular relaxation (grade 1 diastolic dysfunction). - Ventricular septum: The contour showed systolic flattening. These changes are consistent with RV pressure overload. - Left atrium: The atrium was mildly dilated. - Right ventricle: The cavity size was dilated. Wall thickness was normal. Systolic function was moderately reduced. - Pulmonary arteries: Systolic pressure was moderately  increased. PA peak pressure: 45 mm Hg (S).  Antibiotics: None   HPI/Subjective: Relates some cough, mild chest pain/   Objective: Filed Vitals:   11/10/14 0953  BP: 127/97  Pulse: 72  Temp: 98.5 F (36.9 C)  Resp: 18    Intake/Output Summary (Last 24 hours) at 11/10/14 1448 Last data filed at 11/10/14 0951  Gross per 24 hour  Intake   1084 ml  Output   1675 ml  Net   -591 ml   Filed Weights   11/08/14 1019 11/09/14 0546 11/10/14 0545  Weight: 83.008 kg (183 lb) 82.994 kg (182 lb 15.5 oz) 82.8 kg (182 lb 8.7 oz)    Exam:   General:  Alert in no distress.   Cardiovascular: S 1, S 2 RRR  Respiratory: CTA  Abdomen: BS present, soft, nt  Musculoskeletal: trace edema  Data Reviewed: Basic Metabolic Panel:  Recent Labs Lab 11/07/14 0510 11/07/14 1033 11/09/14 0951  NA 136 137 132*  K 4.2 4.0 3.9  CL 103 106 101  CO2 _0 GLUCOSE 181* 168* 217*  BUN _1 CREATININE 1.34* 1.19 1.40*  CALCIUM 8.4* 8.6* 8.5*  MG 2.0  --   --   PHOS 3.3  --   --    Liver Function Tests:  Recent Labs Lab 11/07/14 0510  AST 16  ALT 23  ALKPHOS 58  BILITOT 1.3*  PROT 6.8  ALBUMIN 3.1*   No results for input(s): LIPASE, AMYLASE in the last 168 hours. No results for input(s): AMMONIA in the last 168 hours. CBC:  Recent Labs Lab 11/07/14 0510 11/08/14 0208 11/09/14 0328 11/10/14 0653  WBC 11.7* 9.3 8.6 8.2  NEUTROABS  --   --  5.3  --  HGB 11.9* 11.7* 12.1* 12.3*  HCT 36.8* 35.8* 37.2* 37.7*  MCV 88.5 88.8 89.2 89.1  PLT 164 168 192 214   Cardiac Enzymes:  Recent Labs Lab 11/07/14 0510 11/07/14 1033 11/07/14 1625  TROPONINI 0.33* 0.16* 0.14*   BNP (last 3 results) No results for input(s): BNP in the last 8760 hours.  ProBNP (last 3 results) No results for input(s): PROBNP in the last 8760 hours.  CBG:  Recent Labs Lab 11/09/14 1632 11/09/14 1940 11/09/14 2327 11/10/14 0347 11/10/14 1128  GLUCAP 205* 186* 131* 130* 154*     No results found for this or any previous visit (from the past 240 hour(s)).   Studies: Dg Chest Port 1 View  11/09/2014   CLINICAL DATA:  Pulmonary emboli followup  EXAM: PORTABLE CHEST - 1 VIEW  COMPARISON:  11/07/2014  FINDINGS: Limited inspiratory effect. Cardiac silhouette unchanged and within normal limits. Increased interstitial markings in the mid to lower lung zones bilaterally  IMPRESSION: Mildly increased bilateral mid to lower lung zone opacities. This could be simply due to atelectasis. Possibility of developing pulmonary infarction not excluded.   Electronically Signed   By: Skipper Cliche M.D.   On: 11/09/2014 09:31    Scheduled Meds: . antiseptic oral rinse  7 mL Mouth Rinse BID  . famotidine (PEPCID) IV  20 mg Intravenous Q12H  . guaiFENesin  600 mg Oral BID  . insulin aspart  0-9 Units Subcutaneous 6 times per day  . warfarin  7.5 mg Oral ONCE-1800  . Warfarin - Pharmacist Dosing Inpatient   Does not apply q1800   Continuous Infusions: . heparin 1,600 Units/hr (11/10/14 0700)    Active Problems:   Acute pulmonary embolus   PE (pulmonary embolism)   Pulmonary emboli    Time spent: 35 minutes.     Niel Hummer A  [facility] Pager [phone]. If 7PM-7AM, please contact night-coverage at [email or web address], password [facility] 11/10/2014, 2:48 PM  LOS: 3 days

## 2014-11-10 NOTE — Discharge Instructions (Addendum)
Information on my medicine - Coumadin   (Warfarin)   **This is the same as what was provided to you in Spanish   Why was Coumadin prescribed for you? Coumadin was prescribed for you because you have a blood clot or a medical condition that can cause an increased risk of forming blood clots. Blood clots can cause serious health problems by blocking the flow of blood to the heart, lung, or brain. Coumadin can prevent harmful blood clots from forming. As a reminder your indication for Coumadin is:   Pulmonary Embolism Treatment  What test will check on my response to Coumadin? While on Coumadin (warfarin) you will need to have an INR test regularly to ensure that your dose is keeping you in the desired range. The INR (international normalized ratio) number is calculated from the result of the laboratory test called prothrombin time (PT).  If an INR APPOINTMENT HAS NOT ALREADY BEEN MADE FOR YOU please schedule an appointment to have this lab work done by your health care provider within 7 days. Your INR goal is usually a number between:  2 to 3 or your provider may give you a more narrow range like 2-2.5.  Ask your health care provider during an office visit what your goal INR is.  What  do you need to  know  About  COUMADIN? Take Coumadin (warfarin) exactly as prescribed by your healthcare provider about the same time each day.  DO NOT stop taking without talking to the doctor who prescribed the medication.  Stopping without other blood clot prevention medication to take the place of Coumadin may increase your risk of developing a new clot or stroke.  Get refills before you run out.  What do you do if you miss a dose? If you miss a dose, take it as soon as you remember on the same day then continue your regularly scheduled regimen the next day.  Do not take two doses of Coumadin at the same time.  Important Safety Information A possible side effect of Coumadin (Warfarin) is an increased risk of  bleeding. You should call your healthcare provider right away if you experience any of the following: ? Bleeding from an injury or your nose that does not stop. ? Unusual colored urine (red or dark brown) or unusual colored stools (red or black). ? Unusual bruising for unknown reasons. ? A serious fall or if you hit your head (even if there is no bleeding).  Some foods or medicines interact with Coumadin (warfarin) and might alter your response to warfarin. To help avoid this: ? Eat a balanced diet, maintaining a consistent amount of Vitamin K. ? Notify your provider about major diet changes you plan to make. ? Avoid alcohol or limit your intake to 1 drink for women and 2 drinks for men per day. (1 drink is 5 oz. wine, 12 oz. beer, or 1.5 oz. liquor.)  Make sure that ANY health care provider who prescribes medication for you knows that you are taking Coumadin (warfarin).  Also make sure the healthcare provider who is monitoring your Coumadin knows when you have started a new medication including herbals and non-prescription products.  Coumadin (Warfarin)  Major Drug Interactions  Increased Warfarin Effect Decreased Warfarin Effect  Alcohol (large quantities) Antibiotics (esp. Septra/Bactrim, Flagyl, Cipro) Amiodarone (Cordarone) Aspirin (ASA) Cimetidine (Tagamet) Megestrol (Megace) NSAIDs (ibuprofen, naproxen, etc.) Piroxicam (Feldene) Propafenone (Rythmol SR) Propranolol (Inderal) Isoniazid (INH) Posaconazole (Noxafil) Barbiturates (Phenobarbital) Carbamazepine (Tegretol) Chlordiazepoxide (Librium) Cholestyramine (Questran) Griseofulvin Oral  Contraceptives Rifampin Sucralfate (Carafate) Vitamin K   Coumadin (Warfarin) Major Herbal Interactions  Increased Warfarin Effect Decreased Warfarin Effect  Garlic Ginseng Ginkgo biloba Coenzyme Q10 Green tea St. Johns wort    Coumadin (Warfarin) FOOD Interactions  Eat a consistent number of servings per week of foods HIGH in  Vitamin K (1 serving =  cup)  Collards (cooked, or boiled & drained) Kale (cooked, or boiled & drained) Mustard greens (cooked, or boiled & drained) Parsley *serving size only =  cup Spinach (cooked, or boiled & drained) Swiss chard (cooked, or boiled & drained) Turnip greens (cooked, or boiled & drained)  Eat a consistent number of servings per week of foods MEDIUM-HIGH in Vitamin K (1 serving = 1 cup)  Asparagus (cooked, or boiled & drained) Broccoli (cooked, boiled & drained, or raw & chopped) Brussel sprouts (cooked, or boiled & drained) *serving size only =  cup Lettuce, raw (green leaf, endive, romaine) Spinach, raw Turnip greens, raw & chopped   These websites have more information on Coumadin (warfarin):  http://www.king-russell.com/; https://www.hines.net/;

## 2014-11-11 LAB — PROTIME-INR
INR: 1.71 — ABNORMAL HIGH (ref 0.00–1.49)
Prothrombin Time: 20 seconds — ABNORMAL HIGH (ref 11.6–15.2)

## 2014-11-11 LAB — CBC
HEMATOCRIT: 38.3 % — AB (ref 39.0–52.0)
Hemoglobin: 12.5 g/dL — ABNORMAL LOW (ref 13.0–17.0)
MCH: 29.4 pg (ref 26.0–34.0)
MCHC: 32.6 g/dL (ref 30.0–36.0)
MCV: 90.1 fL (ref 78.0–100.0)
Platelets: 245 10*3/uL (ref 150–400)
RBC: 4.25 MIL/uL (ref 4.22–5.81)
RDW: 13.3 % (ref 11.5–15.5)
WBC: 8.1 10*3/uL (ref 4.0–10.5)

## 2014-11-11 LAB — GLUCOSE, CAPILLARY
GLUCOSE-CAPILLARY: 162 mg/dL — AB (ref 65–99)
GLUCOSE-CAPILLARY: 163 mg/dL — AB (ref 65–99)
Glucose-Capillary: 149 mg/dL — ABNORMAL HIGH (ref 65–99)
Glucose-Capillary: 145 mg/dL — ABNORMAL HIGH (ref 65–99)
Glucose-Capillary: 135 mg/dL — ABNORMAL HIGH (ref 65–99)

## 2014-11-11 LAB — HEPARIN LEVEL (UNFRACTIONATED): HEPARIN UNFRACTIONATED: 0.54 [IU]/mL (ref 0.30–0.70)

## 2014-11-11 MED ORDER — FAMOTIDINE 20 MG PO TABS
20.0000 mg | ORAL_TABLET | Freq: Two times a day (BID) | ORAL | Status: DC
  Administered 2014-11-11 – 2014-11-13 (×5): 20 mg via ORAL
  Filled 2014-11-11 (×7): qty 1

## 2014-11-11 MED ORDER — PATIENT'S GUIDE TO USING COUMADIN BOOK
Freq: Once | Status: DC
  Filled 2014-11-11: qty 1

## 2014-11-11 MED ORDER — WARFARIN SODIUM 6 MG PO TABS
6.0000 mg | ORAL_TABLET | Freq: Once | ORAL | Status: AC
  Administered 2014-11-11: 6 mg via ORAL
  Filled 2014-11-11: qty 1

## 2014-11-11 NOTE — Care Management Note (Signed)
Case Management Note  Patient Details  Name: Antonio Medina MRN: [id number] Date of Birth: 01-Jul-1940  Subjective/Objective:        Admitted with PE            Action/Plan: Patient is here from [place]  visiting his daughter until November 27, 2014; Talked to patient via interpreter about follow up care; Apt made with the [facility] for November 15, 2014 at 8:15 pm - to be seen by MD and to have blood work ( PT/ INR checked). Per MD patient will be discharged home on coumadin which the pt/ family members will cover at discharge. Patient understands the importance of follow up medical care prior to returning to [place].   Expected Discharge Date:  11/11/14               Expected Discharge Plan:  Home/Self Care    Discharge planning Services  CM Consult, Medication Assistance     Choice offered to:  Patient  Status of Service:  In process, will continue to follow  Reola Mosher [phone] 11/11/2014, 1:21 PM

## 2014-11-11 NOTE — Progress Notes (Signed)
ANTICOAGULATION CONSULT NOTE - Follow-Up Consult  Pharmacy Consult for Heparin and Warfarin Indication: pulmonary embolus  No Known Allergies  Patient Measurements: Height:  (167.6 cm) Weight: 180 lb 8 oz (81.874 kg) IBW/kg (Calculated) : 63.8  Assessment: 74yo male found to have saddle PE and started on IV Heparin. He remains therapeutic on heparin, his INR is trending up >> now 1.71. No bleeding complications noted and his CBC and PLT are stable.    Goal of Therapy:  INR 2.0 - 3.0 Heparin level 0.3-0.7 units/ml Monitor platelets by anticoagulation protocol: Yes   Plan:  -Continue heparin at 1600 units/hr -Warfarin 6 mg x 1 today -Monitor daily CBC, HL, INR, s/sx bleeding -Continue heparin or Lovenox for a minimum of 5 days and until INR is therapeutic > 24 hours  Waynette Buttery, PharmD Clinical Pharmacist Pager: [phone] 11/11/2014 8:20 AM

## 2014-11-11 NOTE — Progress Notes (Signed)
CSW and unit RNCM met with patient this afternoon with assistance from [place] to ascertain d/c needs/plan.  Patient states he is visiting his daughter in [place]; he lives in [place] and is here on a temporary VISA. He plans to return to [place] on August 24th.  Patient states that his daughter does not speak [place] but his grandson Randall Hiss does.  Daughter works and is not available until after 7 pm at night.  Via interpreter- it was relayed to patient that he would follow up at the [facility] in [place] after d/c for follow up monitoring of his coumadin levels prior to returning home.  Patient will require interpreter services during his hospitalization and at d/c.  Unit RN is aware of same as he does not speak [place]. Patient was appreciative of RNCM and CSW's visit and denies any current issues or needs at this time. Above discussed with Dr. Tyrell Antonio.  Lorie Phenix. Pauline Good, [place]

## 2014-11-11 NOTE — Progress Notes (Signed)
[facility] PROGRESS NOTE  Antonio Medina EAV:[id number] DOB: [birth year] DOA: 11/07/2014 PCP: No primary care provider on file.  Assessment/Plan: Acute submassive, saddle PE -IV heparin -Duplex lowers: DVT noted in the left common femoral vein. -Needs cancer screening, ana , esr 63 -Chest x ray: Mildly increased bilateral mid to lower lung zone opacities. Thiscould be simply due to atelectasis. Possibility of developing pulmonary infarction not excluded. -Follow intermittent  x ray. Repeat chest x ray 8-9 -Incentive spirometry. -he will need PCP for INR.  -continue with coumadin. INR 1.7.  AKi, cr 1.8- not sure if this is baseline, ATN likley resolving Cr at 1.4. Monitor.  Avoid nephrotoxin.   Diabetes  SSI. Will need prescription for glipizide. Wont be able to resume metformin due to AKI.    Code Status: Full Code.  Family Communication: care discussed with patient in [place]. Discussed care with daughter who was at bedside 8-7 Disposition Plan: remain in the hospital for treatment with IV heparin, coumadin    Consultants:  CCM admitted patient.   Procedures: Doppler: Preliminary findings: DVT noted in the left common femoral vein. No DVT RLE.  ECHO; - Left ventricle: The cavity size was normal. Systolic function was normal. The estimated ejection fraction was in the range of 50% to 55%. Although no diagnostic regional wall motion abnormality was identified, this possibility cannot be completely excluded on the basis of this study. Doppler parameters are consistent with abnormal left ventricular relaxation (grade 1 diastolic dysfunction). - Ventricular septum: The contour showed systolic flattening. These changes are consistent with RV pressure overload. - Left atrium: The atrium was mildly dilated. - Right ventricle: The cavity size was dilated. Wall thickness was normal. Systolic function was moderately reduced. - Pulmonary arteries: Systolic  pressure was moderately increased. PA peak pressure: 45 mm Hg (S).  Antibiotics: None   HPI/Subjective: Feeling ok, no headaches. No dyspnea.   Objective: Filed Vitals:   11/11/14 1300  BP: 128/61  Pulse: 66  Temp: 98.4 F (36.9 C)  Resp: 18    Intake/Output Summary (Last 24 hours) at 11/11/14 1432 Last data filed at 11/11/14 1403  Gross per 24 hour  Intake   1274 ml  Output   1450 ml  Net   -176 ml   Filed Weights   11/09/14 0546 11/10/14 0545 11/11/14 0444  Weight: 82.994 kg (182 lb 15.5 oz) 82.8 kg (182 lb 8.7 oz) 81.874 kg (180 lb 8 oz)    Exam:   General:  Alert in no distress.   Cardiovascular: S 1, S 2 RRR  Respiratory: CTA  Abdomen: BS present, soft, nt  Musculoskeletal: trace edema  Data Reviewed: Basic Metabolic Panel:  Recent Labs Lab 11/07/14 0510 11/07/14 1033 11/09/14 0951  NA 136 137 132*  K 4.2 4.0 3.9  CL 103 106 101  CO2 26 26 25   GLUCOSE 181* 168* 217*  BUN 16 15 16   CREATININE 1.34* 1.19 1.40*  CALCIUM 8.4* 8.6* 8.5*  MG 2.0  --   --   PHOS 3.3  --   --    Liver Function Tests:  Recent Labs Lab 11/07/14 0510  AST 16  ALT 23  ALKPHOS 58  BILITOT 1.3*  PROT 6.8  ALBUMIN 3.1*   No results for input(s): LIPASE, AMYLASE in the last 168 hours. No results for input(s): AMMONIA in the last 168 hours. CBC:  Recent Labs Lab 11/07/14 0510 11/08/14 0208 11/09/14 0328 11/10/14 0653 11/11/14 0401  WBC 11.7* 9.3 8.6 8.2 8.1  NEUTROABS  --   --  5.3  --   --   HGB 11.9* 11.7* 12.1* 12.3* 12.5*  HCT 36.8* 35.8* 37.2* 37.7* 38.3*  MCV 88.5 88.8 89.2 89.1 90.1  PLT 164 168 192 214 245   Cardiac Enzymes:  Recent Labs Lab 11/07/14 0510 11/07/14 1033 11/07/14 1625  TROPONINI 0.33* 0.16* 0.14*   BNP (last 3 results) No results for input(s): BNP in the last 8760 hours.  ProBNP (last 3 results) No results for input(s): PROBNP in the last 8760 hours.  CBG:  Recent Labs Lab 11/10/14 2027 11/10/14 2345  11/11/14 0421 11/11/14 0820 11/11/14 1207  GLUCAP 152* 158* 149* 145* 162*    No results found for this or any previous visit (from the past 240 hour(s)).   Studies: No results found.  Scheduled Meds: . antiseptic oral rinse  7 mL Mouth Rinse BID  . famotidine  20 mg Oral BID  . guaiFENesin  600 mg Oral BID  . insulin aspart  0-9 Units Subcutaneous 6 times per day  . warfarin  6 mg Oral ONCE-1800  . Warfarin - Pharmacist Dosing Inpatient   Does not apply q1800   Continuous Infusions: . heparin 1,600 Units/hr (11/10/14 2212)    Active Problems:   Acute pulmonary embolus   PE (pulmonary embolism)   Pulmonary emboli    Time spent: 25 minutes.     Niel Hummer A  [facility] Pager [phone]. If 7PM-7AM, please contact night-coverage at [email or web address], password [facility] 11/11/2014, 2:32 PM  LOS: 4 days

## 2014-11-12 ENCOUNTER — Inpatient Hospital Stay (HOSPITAL_COMMUNITY): Payer: Self-pay

## 2014-11-12 LAB — BASIC METABOLIC PANEL
Anion gap: 6 (ref 5–15)
BUN: 20 mg/dL (ref 6–20)
CALCIUM: 9.3 mg/dL (ref 8.9–10.3)
CO2: 30 mmol/L (ref 22–32)
CREATININE: 1.61 mg/dL — AB (ref 0.61–1.24)
Chloride: 102 mmol/L (ref 101–111)
GFR, EST AFRICAN AMERICAN: 47 mL/min — AB (ref >60)
GFR, EST NON AFRICAN AMERICAN: 40 mL/min — AB (ref >60)
GLUCOSE: 137 mg/dL — AB (ref 65–99)
Potassium: 5.3 mmol/L — ABNORMAL HIGH (ref 3.5–5.1)
Sodium: 138 mmol/L (ref 135–145)

## 2014-11-12 LAB — CBC
HCT: 39.5 % (ref 39.0–52.0)
HEMOGLOBIN: 12.9 g/dL — AB (ref 13.0–17.0)
MCH: 29.1 pg (ref 26.0–34.0)
MCHC: 32.7 g/dL (ref 30.0–36.0)
MCV: 89 fL (ref 78.0–100.0)
PLATELETS: 261 10*3/uL (ref 150–400)
RBC: 4.44 MIL/uL (ref 4.22–5.81)
RDW: 13.3 % (ref 11.5–15.5)
WBC: 7.7 10*3/uL (ref 4.0–10.5)

## 2014-11-12 LAB — GLUCOSE, CAPILLARY
GLUCOSE-CAPILLARY: 152 mg/dL — AB (ref 65–99)
GLUCOSE-CAPILLARY: 161 mg/dL — AB (ref 65–99)
GLUCOSE-CAPILLARY: 192 mg/dL — AB (ref 65–99)
Glucose-Capillary: 134 mg/dL — ABNORMAL HIGH (ref 65–99)
Glucose-Capillary: 136 mg/dL — ABNORMAL HIGH (ref 65–99)
Glucose-Capillary: 148 mg/dL — ABNORMAL HIGH (ref 65–99)

## 2014-11-12 LAB — PROTIME-INR
INR: 1.96 — AB (ref 0.00–1.49)
PROTHROMBIN TIME: 22.2 s — AB (ref 11.6–15.2)

## 2014-11-12 LAB — HEPARIN LEVEL (UNFRACTIONATED): HEPARIN UNFRACTIONATED: 0.62 [IU]/mL (ref 0.30–0.70)

## 2014-11-12 MED ORDER — SODIUM POLYSTYRENE SULFONATE 15 GM/60ML PO SUSP
15.0000 g | Freq: Once | ORAL | Status: AC
  Administered 2014-11-12: 15 g via ORAL
  Filled 2014-11-12: qty 60

## 2014-11-12 MED ORDER — SODIUM CHLORIDE 0.9 % IV SOLN
INTRAVENOUS | Status: DC
  Administered 2014-11-12 (×2): via INTRAVENOUS

## 2014-11-12 MED ORDER — WARFARIN SODIUM 5 MG PO TABS
5.0000 mg | ORAL_TABLET | Freq: Once | ORAL | Status: AC
  Administered 2014-11-12: 5 mg via ORAL
  Filled 2014-11-12: qty 1

## 2014-11-12 NOTE — Progress Notes (Signed)
[facility] PROGRESS NOTE  Antonio Medina DQQ:[id number] DOB: 1941/03/26 DOA: 11/07/2014 PCP: No primary care provider on file.  Assessment/Plan: Acute submassive, saddle PE -Continue with IV heparin  -Duplex lowers: DVT noted in the left common femoral vein. -Needs cancer screening, ana , esr 63 -Chest x ray: Mildly increased bilateral mid to lower lung zone opacities. Thiscould be simply due to atelectasis. Possibility of developing pulmonary infarction not excluded. -Follow intermittent  x ray. Repeat chest x ray 8-9 with stable atelectasis/  -Incentive spirometry. -he will need PCP for INR.  -continue with coumadin. INR 1.9.  AKi, cr 1.8- not sure if this is baseline, ATN likley resolving Cr at 1.4. Monitor.  Avoid nephrotoxin.  Cr increase to 1.6. Will start IV fluids.  Repeat labs in am.   Diabetes  SSI. Will need prescription for glipizide. Wont be able to resume metformin due to AKI.    Code Status: Full Code.  Family Communication: care discussed with patient in [place]. Discussed care with daughter who was at bedside 8-7 Disposition Plan: remain in the hospital for treatment with IV heparin, coumadin    Consultants:  CCM admitted patient.   Procedures: Doppler: Preliminary findings: DVT noted in the left common femoral vein. No DVT RLE.  ECHO; - Left ventricle: The cavity size was normal. Systolic function was normal. The estimated ejection fraction was in the range of 50% to 55%. Although no diagnostic regional wall motion abnormality was identified, this possibility cannot be completely excluded on the basis of this study. Doppler parameters are consistent with abnormal left ventricular relaxation (grade 1 diastolic dysfunction). - Ventricular septum: The contour showed systolic flattening. These changes are consistent with RV pressure overload. - Left atrium: The atrium was mildly dilated. - Right ventricle: The cavity size was dilated.  Wall thickness was normal. Systolic function was moderately reduced. - Pulmonary arteries: Systolic pressure was moderately increased. PA peak pressure: 45 mm Hg (S).  Antibiotics: None   HPI/Subjective: He is visiting from [place]. He was supposed to go back home 80-24/  Feeling ok, no headaches. No dyspnea. No new complaints.   Objective: Filed Vitals:   11/12/14 1502  BP: 137/69  Pulse: 74  Temp: 98.3 F (36.8 C)  Resp: 18    Intake/Output Summary (Last 24 hours) at 11/12/14 1549 Last data filed at 11/12/14 1501  Gross per 24 hour  Intake 1467.92 ml  Output   1300 ml  Net 167.92 ml   Filed Weights   11/09/14 0546 11/10/14 0545 11/11/14 0444  Weight: 82.994 kg (182 lb 15.5 oz) 82.8 kg (182 lb 8.7 oz) 81.874 kg (180 lb 8 oz)    Exam:   General:  Alert in no distress.   Cardiovascular: S 1, S 2 RRR  Respiratory: CTA  Abdomen: BS present, soft, nt  Musculoskeletal: trace edema  Data Reviewed: Basic Metabolic Panel:  Recent Labs Lab 11/07/14 0510 11/07/14 1033 11/09/14 0951 11/12/14 0458  NA 136 137 132* 138  K 4.2 4.0 3.9 5.3*  CL 103 106 101 102  CO2 _0 GLUCOSE 181* 168* 217* 137*  BUN _1 CREATININE 1.34* 1.19 1.40* 1.61*  CALCIUM 8.4* 8.6* 8.5* 9.3  MG 2.0  --   --   --   PHOS 3.3  --   --   --    Liver Function Tests:  Recent Labs Lab 11/07/14 0510  AST 16  ALT 23  ALKPHOS 58  BILITOT 1.3*  PROT 6.8  ALBUMIN 3.1*   No results for input(s): LIPASE, AMYLASE in the last 168 hours. No results for input(s): AMMONIA in the last 168 hours. CBC:  Recent Labs Lab 11/08/14 0208 11/09/14 0328 11/10/14 0653 11/11/14 0401 11/12/14 0458  WBC 9.3 8.6 8.2 8.1 7.7  NEUTROABS  --  5.3  --   --   --   HGB 11.7* 12.1* 12.3* 12.5* 12.9*  HCT 35.8* 37.2* 37.7* 38.3* 39.5  MCV 88.8 89.2 89.1 90.1 89.0  PLT 168 192 214 245 261   Cardiac Enzymes:  Recent Labs Lab 11/07/14 0510 11/07/14 1033 11/07/14 1625   TROPONINI 0.33* 0.16* 0.14*   BNP (last 3 results) No results for input(s): BNP in the last 8760 hours.  ProBNP (last 3 results) No results for input(s): PROBNP in the last 8760 hours.  CBG:  Recent Labs Lab 11/12/14 0011 11/12/14 0412 11/12/14 0759 11/12/14 1132 11/12/14 1528  GLUCAP 136* 148* 134* 152* 161*    No results found for this or any previous visit (from the past 240 hour(s)).   Studies: Dg Chest 2 View  11/12/2014   CLINICAL DATA:  Recent pulmonary embolism, history of diabetes.  EXAM: CHEST  2 VIEW  COMPARISON:  Portable chest x-ray of November 09, 2014  FINDINGS: There is persistent subsegmental atelectasis at the right lung base. Minimal similar findings are noted at at the left lung base medially.There is no alveolar infiltrate. There is no significant pleural effusion. The heart is top-normal in size. The pulmonary vascularity is not engorged. The mediastinum is normal in width. The bony thorax is unremarkable.  IMPRESSION: Persistent basilar atelectasis predominantly on the right. There is no pulmonary edema.   Electronically Signed   By: David  Martinique M.D.   On: 11/12/2014 07:57    Scheduled Meds: . antiseptic oral rinse  7 mL Mouth Rinse BID  . famotidine  20 mg Oral BID  . guaiFENesin  600 mg Oral BID  . insulin aspart  0-9 Units Subcutaneous 6 times per day  . warfarin  5 mg Oral ONCE-1800  . Warfarin - Pharmacist Dosing Inpatient   Does not apply q1800   Continuous Infusions: . sodium chloride 75 mL/hr at 11/12/14 0819  . heparin 1,600 Units/hr (11/12/14 0754)    Active Problems:   Acute pulmonary embolus   PE (pulmonary embolism)   Pulmonary emboli    Time spent: 25 minutes.     Niel Hummer A  [facility] Pager [phone]. If 7PM-7AM, please contact night-coverage at [email or web address], password [facility] 11/12/2014, 3:49 PM  LOS: 5 days

## 2014-11-12 NOTE — Progress Notes (Signed)
ANTICOAGULATION CONSULT NOTE - Follow-Up Consult  Pharmacy Consult for Heparin and Warfarin Indication: pulmonary embolus  No Known Allergies  Patient Measurements: Height:  (167.6 cm) Weight:  (pt not on floor to weigh) IBW/kg (Calculated) : 63.8  Assessment: 74yo male found to have saddle PE and started on IV Heparin. He remains therapeutic on heparin, his INR continues to trend up >> now 1.96. No bleeding complications noted and his CBC and PLT are stable.    Goal of Therapy:  INR 2.0 - 3.0 Heparin level 0.3-0.7 units/ml Monitor platelets by anticoagulation protocol: Yes   Plan:  -Continue heparin at 1600 units/hr -Warfarin  x 1 today -Monitor daily CBC, HL, INR, s/sx bleeding -Continue heparin or Lovenox for a minimum of 5 days and until INR is therapeutic > 24 hours  Waynette Buttery, PharmD Clinical Pharmacist Pager: [phone] 11/12/2014 11:03 AM

## 2014-11-13 DIAGNOSIS — I82402 Acute embolism and thrombosis of unspecified deep veins of left lower extremity: Secondary | ICD-10-CM

## 2014-11-13 DIAGNOSIS — I5032 Chronic diastolic (congestive) heart failure: Secondary | ICD-10-CM

## 2014-11-13 DIAGNOSIS — N179 Acute kidney failure, unspecified: Secondary | ICD-10-CM

## 2014-11-13 DIAGNOSIS — I2699 Other pulmonary embolism without acute cor pulmonale: Secondary | ICD-10-CM

## 2014-11-13 DIAGNOSIS — I82409 Acute embolism and thrombosis of unspecified deep veins of unspecified lower extremity: Secondary | ICD-10-CM | POA: Insufficient documentation

## 2014-11-13 DIAGNOSIS — E0822 Diabetes mellitus due to underlying condition with diabetic chronic kidney disease: Secondary | ICD-10-CM | POA: Insufficient documentation

## 2014-11-13 DIAGNOSIS — K219 Gastro-esophageal reflux disease without esophagitis: Secondary | ICD-10-CM | POA: Insufficient documentation

## 2014-11-13 DIAGNOSIS — N189 Chronic kidney disease, unspecified: Secondary | ICD-10-CM

## 2014-11-13 LAB — GLUCOSE, CAPILLARY
GLUCOSE-CAPILLARY: 135 mg/dL — AB (ref 65–99)
GLUCOSE-CAPILLARY: 119 mg/dL — AB (ref 65–99)
GLUCOSE-CAPILLARY: 191 mg/dL — AB (ref 65–99)
Glucose-Capillary: 154 mg/dL — ABNORMAL HIGH (ref 65–99)
Glucose-Capillary: 191 mg/dL — ABNORMAL HIGH (ref 65–99)

## 2014-11-13 LAB — BASIC METABOLIC PANEL
ANION GAP: 7 (ref 5–15)
BUN: 17 mg/dL (ref 6–20)
CO2: 24 mmol/L (ref 22–32)
Calcium: 8.8 mg/dL — ABNORMAL LOW (ref 8.9–10.3)
Chloride: 107 mmol/L (ref 101–111)
Creatinine, Ser: 1.33 mg/dL — ABNORMAL HIGH (ref 0.61–1.24)
GFR calc Af Amer: 59 mL/min — ABNORMAL LOW (ref >60)
GFR calc non Af Amer: 51 mL/min — ABNORMAL LOW (ref >60)
Glucose, Bld: 199 mg/dL — ABNORMAL HIGH (ref 65–99)
Potassium: 4.2 mmol/L (ref 3.5–5.1)
SODIUM: 138 mmol/L (ref 135–145)

## 2014-11-13 LAB — CBC
HEMATOCRIT: 37.6 % — AB (ref 39.0–52.0)
HEMOGLOBIN: 12.3 g/dL — AB (ref 13.0–17.0)
MCH: 29.6 pg (ref 26.0–34.0)
MCHC: 32.7 g/dL (ref 30.0–36.0)
MCV: 90.4 fL (ref 78.0–100.0)
Platelets: 259 10*3/uL (ref 150–400)
RBC: 4.16 MIL/uL — ABNORMAL LOW (ref 4.22–5.81)
RDW: 13.6 % (ref 11.5–15.5)
WBC: 7.8 10*3/uL (ref 4.0–10.5)

## 2014-11-13 LAB — PROTIME-INR
INR: 2.22 — AB (ref 0.00–1.49)
PROTHROMBIN TIME: 24.4 s — AB (ref 11.6–15.2)

## 2014-11-13 LAB — HEPARIN LEVEL (UNFRACTIONATED): Heparin Unfractionated: 0.7 IU/mL (ref 0.30–0.70)

## 2014-11-13 MED ORDER — GLIPIZIDE 5 MG PO TABS: 2.5000 mg | ORAL_TABLET | Freq: Two times a day (BID) | ORAL | Status: AC

## 2014-11-13 MED ORDER — FAMOTIDINE 20 MG PO TABS: 20.0000 mg | ORAL_TABLET | Freq: Two times a day (BID) | ORAL | Status: AC

## 2014-11-13 MED ORDER — TRAMADOL HCL 50 MG PO TABS: 25.0000 mg | ORAL_TABLET | Freq: Three times a day (TID) | ORAL | Status: AC | PRN

## 2014-11-13 MED ORDER — ACETAMINOPHEN 500 MG PO TABS: 500.0000 mg | ORAL_TABLET | Freq: Four times a day (QID) | ORAL | Status: AC | PRN

## 2014-11-13 MED ORDER — WARFARIN SODIUM 5 MG PO TABS
5.0000 mg | ORAL_TABLET | Freq: Once | ORAL | Status: AC
  Administered 2014-11-13: 5 mg via ORAL
  Filled 2014-11-13: qty 1

## 2014-11-13 MED ORDER — WARFARIN SODIUM 5 MG PO TABS: 5.0000 mg | ORAL_TABLET | Freq: Once | ORAL | Status: AC

## 2014-11-13 NOTE — Progress Notes (Signed)
ANTICOAGULATION CONSULT NOTE - Follow-Up Consult  Pharmacy Consult for Heparin and Warfarin Indication: pulmonary embolus  No Known Allergies  Patient Measurements: Height:  (167.6 cm) Weight: 179 lb 0.2 oz (81.2 kg) IBW/kg (Calculated) : 63.8  Assessment: 74yo male found to have saddle PE and started on IV Heparin bridge to therapeutic INR. He remains therapeutic on heparin, his INR is now therapeutic at 2.22. No bleeding complications noted, his CBC and PLT are stable.  If pt discharged today, would recommend warfarin  daily w/ follow-up in [facility] on Friday or Monday.  Goal of Therapy:  INR 2.0 - 3.0 Heparin level 0.3-0.7 units/ml Monitor platelets by anticoagulation protocol: Yes   Plan:  -Discontinue heparin -Warfarin  x 1 today -Monitor daily CBC, INR, s/sx of bleeding  Waynette Buttery, PharmD Clinical Pharmacist Pager: [phone] 11/13/2014 9:37 AM

## 2014-11-13 NOTE — Discharge Summary (Signed)
Physician Discharge Summary  Antonio Medina YNW:[id number] DOB: [birth year] DOA: 11/07/2014  PCP: No primary care provider on file.  Admit date: 11/07/2014 Discharge date: 11/13/2014  Time spent: >30 minutes  Recommendations for Outpatient Follow-up:  1. Follow INR and adjust coumadin as needed 2. Repeat CBC to follow Hgb and platelets level 3. 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 Discharge home with follow up of his INR and renal function at [facility] in [place] on 11/15/14  Diet recommendation: heart healthy and low carbohydrates  Filed Weights   11/10/14 0545 11/11/14 0444 11/13/14 0430  Weight: 82.8 kg (182 lb 8.7 oz) 81.874 kg (180 lb 8 oz) 81.2 kg (179 lb 0.2 oz)    History of present illness:  74 year old Spanish only speaking with hx of diabetes and GERD; presented with chest pain ongoing for 2 weeks or so. Patient came to [place] from [place] driving with family in order to visit his daughter. He has not seen any doctors after CP started. He reported substernal chest pain radiating to his right side. He went to [facility] ED where he was found to have positive d-dimer and CT angio showed saddle PE; Patient was placed on heparin and transferred to [facility]. First troponin was negative, repeat troponin was 0.3 . Labs also showed a creatinine 1.8. He was mildly hypoxic to 91% on room air  Hospital Course:  Acute submassive, saddle PE and left LE DVT -Duplex lowers: DVT noted in the left common femoral vein. -due to long trip from [place] to [employer] (>4 days by car) -treated with dual therapy (heparin and coumadin) for 6 days -after 48 hours in therapeutic range discharge on coumadin -appointment made to have INR  check and further coumadin adjustments to be done as an outpatient   AKi on CKD, cr 1.8 on admisison- not sure of his baseline (patient visiting country) -Cr at discharge 1.3 -he expressed hearing doctors back home talking about creatinine in 1.3-1.4 range (stage 2-3 base on GFR)  -Advise to avoid nephrotoxin agents and keep himself well hydrated.  -metformin discontinue for now due to renal function status  Diabetestype 2 with nephropathy  -advise to follow low carb diet -started on glipizide  -metformin discontinue due to renal function  GERD: -discharge on famotidine -lifestyle changes reviewed with patients  Chronic diastolic heart failure (grade 1, with preserved EF) -advise to follow low sodium diet -compensated during this admission -no need for diuretics during hospitalization    Elevated troponin -due to renal falure/PE -no acute ischemic changes on EKG or telemetry -no further CP -2-D echo w/o wall motion abnormalities   Procedures:  LE Doppler: Preliminary findings: DVT noted in the left common femoral vein. No DVT RLE.   ECHO; - Left ventricle: The cavity size was normal. Systolic function was normal. The estimated ejection fraction was in the range of 50% to 55%. Although no diagnostic regional wall motion abnormality was identified, this possibility cannot be completely excluded on the basis of this study. Doppler parameters are consistent with abnormal left ventricular relaxation (grade 1 diastolic dysfunction). - Ventricular septum: The contour showed systolic flattening. These changes are consistent with RV pressure overload. - Left atrium: The atrium was mildly dilated. - Right ventricle: The cavity size was dilated.  Wall thickness was normal. Systolic function was moderately reduced. - Pulmonary arteries: Systolic pressure was moderately increased. PA peak pressure: 45 mm Hg (S).  Consultations:  None   Discharge  Exam: Filed Vitals:   11/13/14 0838  BP: 113/62  Pulse: 65  Temp: 98.6 F (37 C)  Resp: 18    General: AAOX3, afebrile, no CP and breathing comfortable Cardiovascular: S1 and S2, no rubs or gallops  Respiratory: CTA bilaterally, no use of accessofy muscles   Abd: soft, NT, ND, positive BS Extremities: no edema, no cyanosis; denies pain  Discharge Instructions   Discharge Instructions    Diet - low sodium heart healthy    Complete by:  As directed      Discharge instructions    Complete by:  As directed   Avoid the use of NSAID's Take medications as prescribed Please follow a low carbohydrates, low sodium and heart healthy diet Keep yourself well hydrated  Start taking your coumadin 5mg  daily on the day of discharge  Please stop Metformin and start glipizide for sugar control          Current Discharge Medication List    START taking these medications   Details  acetaminophen (TYLENOL) 500 MG tablet Take 1 tablet (500 mg total) by mouth every 6 (six) hours as needed for mild pain or fever (temp > 101.5).    famotidine (PEPCID) 20 MG tablet Take 1 tablet (20 mg total) by mouth 2 (two) times daily. Qty: 60 tablet, Refills: 0    glipiZIDE (GLUCOTROL) 5 MG tablet Take 0.5 tablets (2.5 mg total) by mouth 2 (two) times daily before a meal. Qty: 60 tablet, Refills: 0    traMADol (ULTRAM) 50 MG tablet Take 0.5 tablets (25 mg total) by mouth every 8 (eight) hours as needed for severe pain. Qty: 40 tablet, Refills: 0    warfarin (COUMADIN) 5 MG tablet Take 1 tablet (5 mg total) by mouth one time only at 6 PM. Qty: 30 tablet, Refills: 0      STOP taking these medications     aspirin-sod bicarb-citric acid (ALKA-SELTZER) 325 MG TBEF tablet      metFORMIN (GLUCOPHAGE) 500 MG tablet      UNKNOWN TO PATIENT        No Known Allergies Follow-up Information    Follow up with [facility] On 11/15/2014.   Why:  at 8:15 am; for you to see a doctor and have  your blood checked   Contact information:   [address] [place], [place] [address] [phone]   Open until 5:00 PM       The results of significant diagnostics from this hospitalization (including imaging, microbiology, ancillary and laboratory) are listed below for reference.    Significant Diagnostic Studies: Dg Chest 2 View  11/12/2014   CLINICAL DATA:  Recent pulmonary embolism, history of diabetes.  EXAM: CHEST  2 VIEW  COMPARISON:  Portable chest x-ray of November 09, 2014  FINDINGS: There is persistent subsegmental atelectasis at the right lung base. Minimal similar findings are noted at at the left lung base medially.There is no alveolar infiltrate. There is no significant pleural effusion. The heart is top-normal in size. The pulmonary vascularity is not engorged. The mediastinum is normal in width. The bony thorax is unremarkable.  IMPRESSION: Persistent basilar atelectasis predominantly on the right. There is no pulmonary edema.   Electronically Signed   By: David  [place] M.D.   On: 11/12/2014 07:57  Dg Chest Port 1 View  11/09/2014   CLINICAL DATA:  Pulmonary emboli followup  EXAM: PORTABLE CHEST - 1 VIEW  COMPARISON:  11/07/2014  FINDINGS: Limited inspiratory effect. Cardiac silhouette unchanged and within normal limits. Increased interstitial markings in the mid to lower lung zones bilaterally  IMPRESSION: Mildly increased bilateral mid to lower lung zone opacities. This could be simply due to atelectasis. Possibility of developing pulmonary infarction not excluded.   Electronically Signed   By: Esperanza Heir M.D.   On: 11/09/2014 09:31   Dg Chest Port 1 View  11/07/2014   CLINICAL DATA:  Dyspnea  EXAM: PORTABLE CHEST - 1 VIEW  COMPARISON:  None.  FINDINGS: There is patchy opacity in the lateral right base with elevation of the hemidiaphragm. This may represent atelectasis or scarring or early infiltrate. The left lung is clear. There is no large effusion. Heart size is  normal.  IMPRESSION: Patchy right lateral base opacity. This could represent scarring, atelectasis or early infectious infiltrate.   Electronically Signed   By: Ellery Plunk M.D.   On: 11/07/2014 05:08   Labs: Basic Metabolic Panel:  Recent Labs Lab 11/07/14 0510 11/07/14 1033 11/09/14 0951 11/12/14 0458 11/13/14 0900  NA 136 137 132* 138 138  K 4.2 4.0 3.9 5.3* 4.2  CL 103 106 101 102 107  CO2 GLUCOSE 181* 168* 217* 137* 199*  BUN CREATININE 1.34* 1.19 1.40* 1.61* 1.33*  CALCIUM 8.4* 8.6* 8.5* 9.3 8.8*  MG 2.0  --   --   --   --   PHOS 3.3  --   --   --   --    Liver Function Tests:  Recent Labs Lab 11/07/14 0510  AST 16  ALT 23  ALKPHOS 58  BILITOT 1.3*  PROT 6.8  ALBUMIN 3.1*   CBC:  Recent Labs Lab 11/09/14 0328 11/10/14 0653 11/11/14 0401 11/12/14 0458 11/13/14 0316  WBC 8.6 8.2 8.1 7.7 7.8  NEUTROABS 5.3  --   --   --   --   HGB 12.1* 12.3* 12.5* 12.9* 12.3*  HCT 37.2* 37.7* 38.3* 39.5 37.6*  MCV 89.2 89.1 90.1 89.0 90.4  PLT 192 214 245 261 259   Cardiac Enzymes:  Recent Labs Lab 11/07/14 0510 11/07/14 1033 11/07/14 1625  TROPONINI 0.33* 0.16* 0.14*   CBG:  Recent Labs Lab 11/12/14 1528 11/12/14 2008 11/13/14 0137 11/13/14 0429 11/13/14 0814  GLUCAP 161* 192* 135* 119* 154*    Signed:  Vassie Loll  [facility] 11/13/2014, 10:27 AM

## 2016-02-04 IMAGING — DX DG CHEST 1V PORT
1 series · 1 of 1 positions shown · non-contrast
Comparison: 11/07/2014

CLINICAL DATA: Pulmonary emboli followup

EXAM:
PORTABLE CHEST - 1 VIEW

[chest ap]
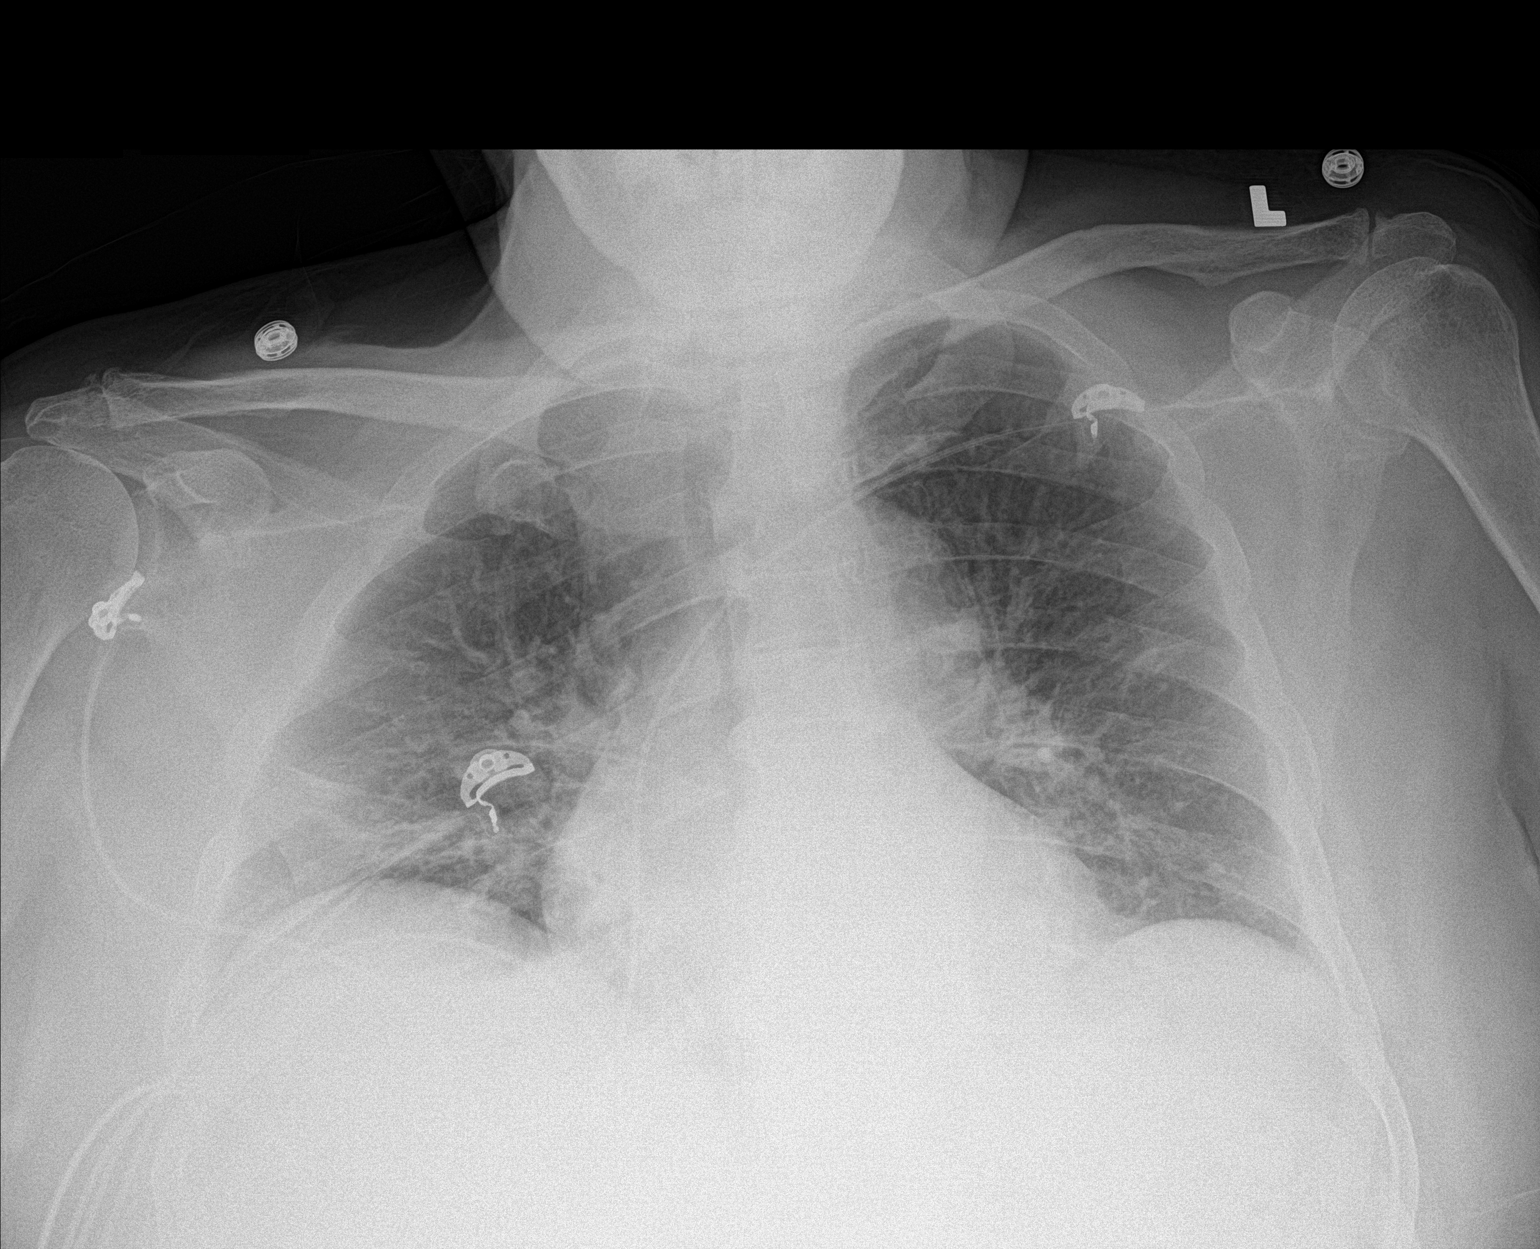

[1 of 1 positions shown; findings below may reference images not displayed]

FINDINGS: Limited inspiratory effect. Cardiac silhouette unchanged and within
normal limits. Increased interstitial markings in the mid to lower
lung zones bilaterally
IMPRESSION: Mildly increased bilateral mid to lower lung zone opacities. This
could be simply due to atelectasis. Possibility of developing
pulmonary infarction not excluded.

## 2016-02-07 IMAGING — CR DG CHEST 2V
2 series · 2 of 2 positions shown · non-contrast
Comparison: Portable chest x-ray November 09, 2014

CLINICAL DATA: Recent pulmonary embolism, history of diabetes.

EXAM:
CHEST  2 VIEW

[chest lat]
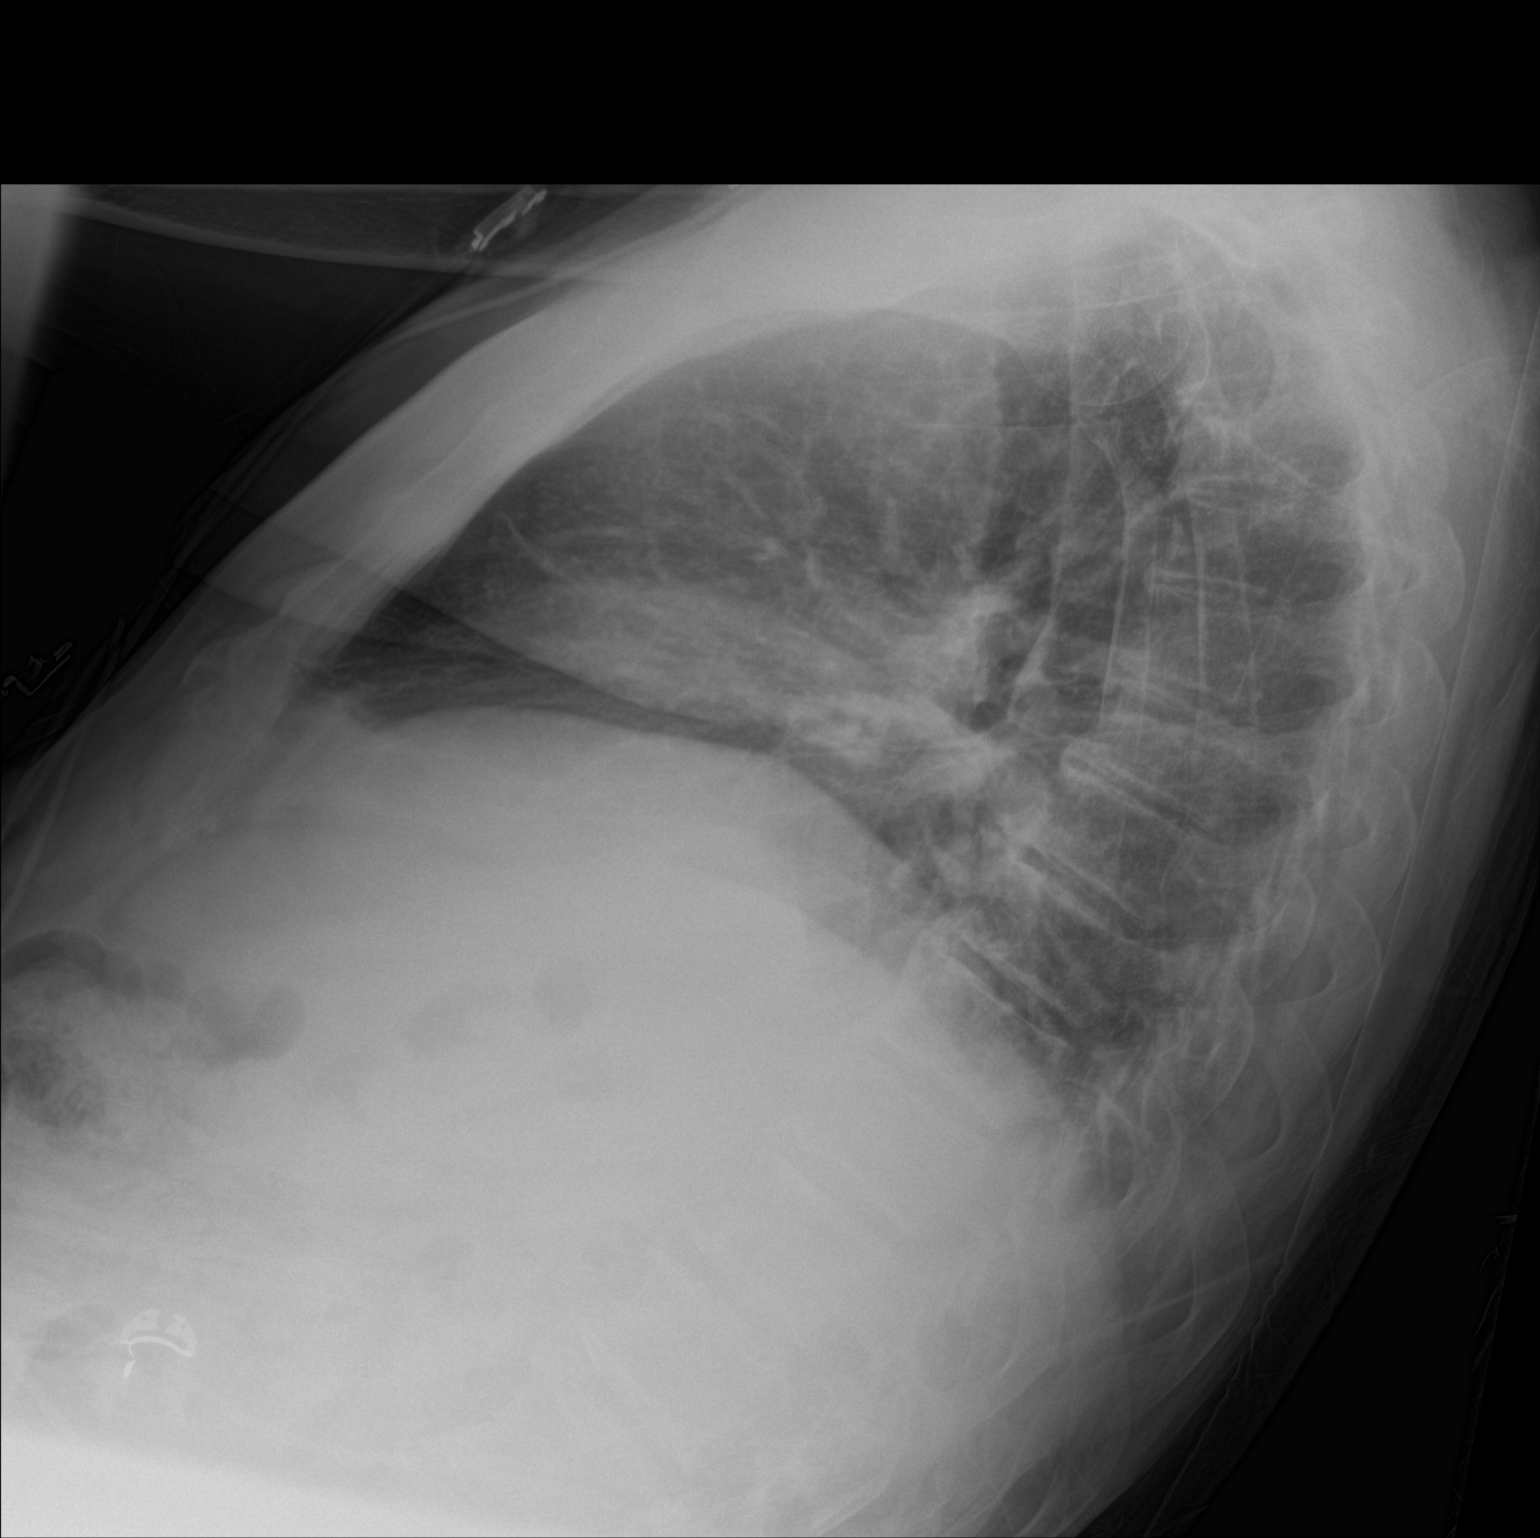

[chest ap]
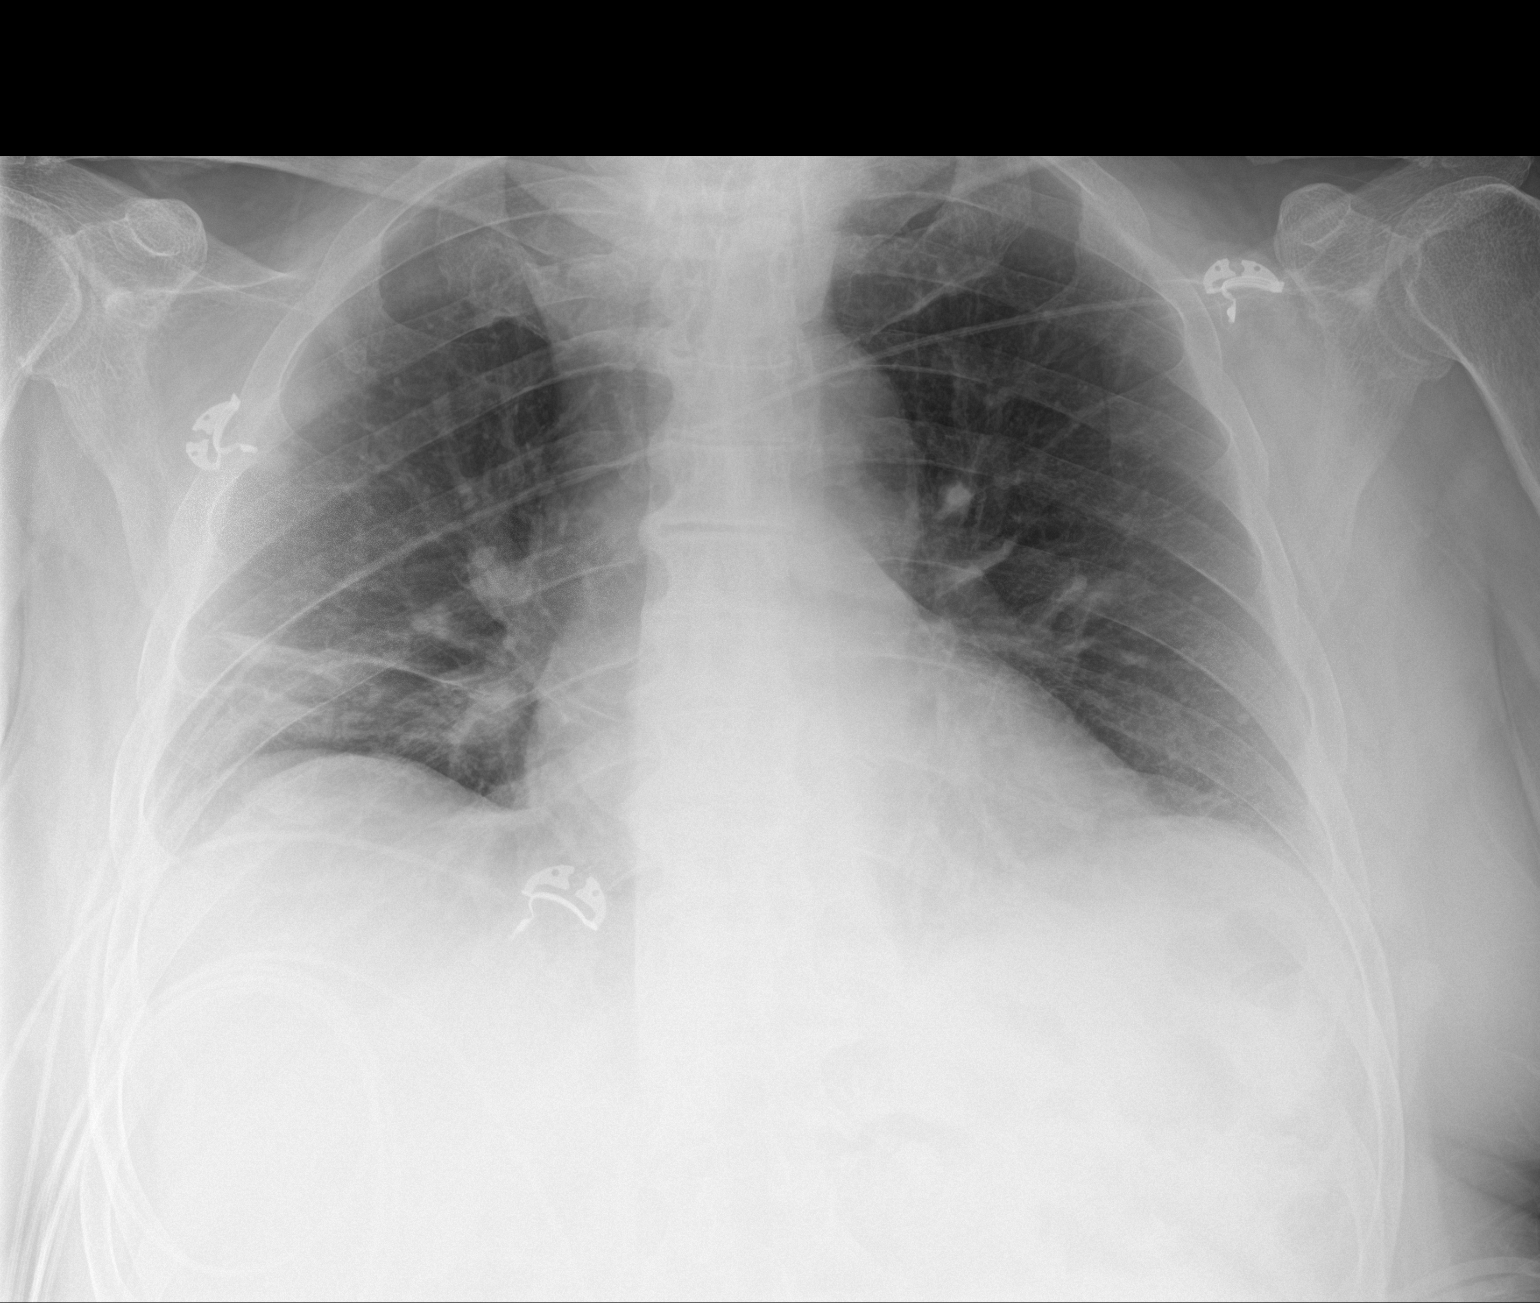

[2 of 2 positions shown; findings below may reference images not displayed]

FINDINGS: There is persistent subsegmental atelectasis at the right lung base.
Minimal similar findings are noted at at the left lung base
medially.There is no alveolar infiltrate. There is no significant
pleural effusion. The heart is top-normal in size. The pulmonary
vascularity is not engorged. The mediastinum is normal in width. The
bony thorax is unremarkable.
IMPRESSION: Persistent basilar atelectasis predominantly on the right. There is
no pulmonary edema.

## 2023-06-30 ENCOUNTER — Other Ambulatory Visit: Payer: Self-pay | Admitting: Nurse Practitioner

## 2023-06-30 DIAGNOSIS — Z1231 Encounter for screening mammogram for malignant neoplasm of breast: Secondary | ICD-10-CM

## 2023-07-06 ENCOUNTER — Inpatient Hospital Stay: Admission: RE | Admit: 2023-07-06 | Payer: Self-pay | Source: Ambulatory Visit
